# Patient Record
Sex: Female | Born: 1935 | Race: Black or African American | Hispanic: No | Smoking: Never smoker
Health system: Southern US, Community
[De-identification: ages and names within clinical notes are randomized; demographics above are authoritative.]

## PROBLEM LIST (undated history)

## (undated) DIAGNOSIS — M545 Low back pain, unspecified: Secondary | ICD-10-CM

## (undated) DIAGNOSIS — I6789 Other cerebrovascular disease: Secondary | ICD-10-CM

## (undated) DIAGNOSIS — E119 Type 2 diabetes mellitus without complications: Secondary | ICD-10-CM

## (undated) DIAGNOSIS — R269 Unspecified abnormalities of gait and mobility: Secondary | ICD-10-CM

## (undated) DIAGNOSIS — I10 Essential (primary) hypertension: Secondary | ICD-10-CM

## (undated) DIAGNOSIS — I359 Nonrheumatic aortic valve disorder, unspecified: Secondary | ICD-10-CM

## (undated) DIAGNOSIS — L89109 Pressure ulcer of unspecified part of back, unspecified stage: Secondary | ICD-10-CM

## (undated) DIAGNOSIS — G959 Disease of spinal cord, unspecified: Secondary | ICD-10-CM

## (undated) DIAGNOSIS — M1990 Unspecified osteoarthritis, unspecified site: Secondary | ICD-10-CM

## (undated) DIAGNOSIS — E785 Hyperlipidemia, unspecified: Secondary | ICD-10-CM

## (undated) DIAGNOSIS — M6281 Muscle weakness (generalized): Secondary | ICD-10-CM

## (undated) DIAGNOSIS — D649 Anemia, unspecified: Secondary | ICD-10-CM

## (undated) DIAGNOSIS — I6350 Cerebral infarction due to unspecified occlusion or stenosis of unspecified cerebral artery: Secondary | ICD-10-CM

## (undated) DIAGNOSIS — H269 Unspecified cataract: Secondary | ICD-10-CM

## (undated) HISTORY — DX: Unspecified cataract: H26.9

## (undated) HISTORY — DX: Anemia, unspecified: D64.9

## (undated) HISTORY — DX: Unspecified osteoarthritis, unspecified site: M19.90

## (undated) HISTORY — DX: Disease of spinal cord, unspecified: G95.9

## (undated) HISTORY — DX: Unspecified abnormalities of gait and mobility: R26.9

## (undated) HISTORY — DX: Essential (primary) hypertension: I10

## (undated) HISTORY — DX: Muscle weakness (generalized): M62.81

## (undated) HISTORY — DX: Cerebral infarction due to unspecified occlusion or stenosis of unspecified cerebral artery: I63.50

## (undated) HISTORY — PX: ABDOMINAL HYSTERECTOMY: SHX81

## (undated) HISTORY — PX: APPENDECTOMY: SHX54

## (undated) HISTORY — PX: OOPHORECTOMY: SHX86

## (undated) HISTORY — DX: Hyperlipidemia, unspecified: E78.5

## (undated) HISTORY — DX: Nonrheumatic aortic valve disorder, unspecified: I35.9

## (undated) HISTORY — DX: Other cerebrovascular disease: I67.89

## (undated) HISTORY — DX: Low back pain: M54.5

## (undated) HISTORY — DX: Pressure ulcer of unspecified part of back, unspecified stage: L89.109

## (undated) HISTORY — DX: Low back pain, unspecified: M54.50

## (undated) HISTORY — DX: Type 2 diabetes mellitus without complications: E11.9

---

## 1998-04-16 ENCOUNTER — Ambulatory Visit (HOSPITAL_COMMUNITY): Admission: RE | Admit: 1998-04-16 | Discharge: 1998-04-16 | Payer: Self-pay | Admitting: Family Medicine

## 2002-01-29 ENCOUNTER — Ambulatory Visit (HOSPITAL_COMMUNITY): Admission: RE | Admit: 2002-01-29 | Discharge: 2002-01-29 | Payer: Self-pay | Admitting: Internal Medicine

## 2002-01-29 ENCOUNTER — Encounter: Payer: Self-pay | Admitting: Internal Medicine

## 2002-01-31 ENCOUNTER — Encounter: Payer: Self-pay | Admitting: Internal Medicine

## 2002-01-31 ENCOUNTER — Ambulatory Visit (HOSPITAL_COMMUNITY): Admission: RE | Admit: 2002-01-31 | Discharge: 2002-01-31 | Payer: Self-pay | Admitting: Internal Medicine

## 2002-07-27 ENCOUNTER — Encounter: Payer: Self-pay | Admitting: Emergency Medicine

## 2002-07-27 ENCOUNTER — Emergency Department (HOSPITAL_COMMUNITY): Admission: EM | Admit: 2002-07-27 | Discharge: 2002-07-27 | Payer: Self-pay | Admitting: Emergency Medicine

## 2004-03-07 LAB — HM COLONOSCOPY: HM Colonoscopy: NORMAL

## 2004-06-01 ENCOUNTER — Ambulatory Visit: Payer: Self-pay | Admitting: Family Medicine

## 2004-06-14 ENCOUNTER — Ambulatory Visit: Payer: Self-pay | Admitting: Family Medicine

## 2004-06-16 ENCOUNTER — Ambulatory Visit: Payer: Self-pay | Admitting: Family Medicine

## 2004-09-13 ENCOUNTER — Ambulatory Visit: Payer: Self-pay | Admitting: Family Medicine

## 2004-09-18 ENCOUNTER — Emergency Department (HOSPITAL_COMMUNITY): Admission: EM | Admit: 2004-09-18 | Discharge: 2004-09-18 | Payer: Self-pay | Admitting: Emergency Medicine

## 2004-10-17 ENCOUNTER — Ambulatory Visit: Payer: Self-pay | Admitting: Family Medicine

## 2004-11-30 ENCOUNTER — Ambulatory Visit: Payer: Self-pay | Admitting: Family Medicine

## 2004-12-12 ENCOUNTER — Emergency Department (HOSPITAL_COMMUNITY): Admission: EM | Admit: 2004-12-12 | Discharge: 2004-12-12 | Payer: Self-pay | Admitting: Emergency Medicine

## 2005-01-12 ENCOUNTER — Ambulatory Visit: Payer: Self-pay | Admitting: Family Medicine

## 2005-04-24 ENCOUNTER — Ambulatory Visit: Payer: Self-pay | Admitting: Family Medicine

## 2005-06-08 ENCOUNTER — Encounter (INDEPENDENT_AMBULATORY_CARE_PROVIDER_SITE_OTHER): Payer: Self-pay | Admitting: Specialist

## 2005-06-08 ENCOUNTER — Ambulatory Visit (HOSPITAL_COMMUNITY): Admission: RE | Admit: 2005-06-08 | Discharge: 2005-06-08 | Payer: Self-pay | Admitting: Gastroenterology

## 2005-06-14 ENCOUNTER — Ambulatory Visit: Payer: Self-pay | Admitting: Family Medicine

## 2005-08-08 ENCOUNTER — Ambulatory Visit: Payer: Self-pay | Admitting: Family Medicine

## 2005-10-31 ENCOUNTER — Ambulatory Visit: Payer: Self-pay | Admitting: Family Medicine

## 2005-12-12 ENCOUNTER — Ambulatory Visit: Payer: Self-pay | Admitting: Family Medicine

## 2006-03-02 ENCOUNTER — Ambulatory Visit: Payer: Self-pay | Admitting: Family Medicine

## 2006-06-14 ENCOUNTER — Ambulatory Visit: Payer: Self-pay | Admitting: Internal Medicine

## 2006-09-17 ENCOUNTER — Ambulatory Visit: Payer: Self-pay | Admitting: Family Medicine

## 2006-11-29 ENCOUNTER — Ambulatory Visit: Payer: Self-pay | Admitting: Family Medicine

## 2006-12-26 ENCOUNTER — Ambulatory Visit: Payer: Self-pay | Admitting: Family Medicine

## 2007-02-06 DIAGNOSIS — M1990 Unspecified osteoarthritis, unspecified site: Secondary | ICD-10-CM | POA: Insufficient documentation

## 2007-02-06 DIAGNOSIS — E669 Obesity, unspecified: Secondary | ICD-10-CM | POA: Insufficient documentation

## 2007-02-06 DIAGNOSIS — E1169 Type 2 diabetes mellitus with other specified complication: Secondary | ICD-10-CM

## 2007-02-06 DIAGNOSIS — E785 Hyperlipidemia, unspecified: Secondary | ICD-10-CM

## 2007-02-06 DIAGNOSIS — I10 Essential (primary) hypertension: Secondary | ICD-10-CM | POA: Insufficient documentation

## 2007-02-07 ENCOUNTER — Ambulatory Visit: Payer: Self-pay | Admitting: Internal Medicine

## 2007-07-03 ENCOUNTER — Ambulatory Visit: Payer: Self-pay | Admitting: Internal Medicine

## 2007-12-07 ENCOUNTER — Ambulatory Visit: Payer: Self-pay | Admitting: Cardiovascular Disease

## 2007-12-07 ENCOUNTER — Inpatient Hospital Stay (HOSPITAL_COMMUNITY): Admission: EM | Admit: 2007-12-07 | Discharge: 2007-12-12 | Payer: Self-pay | Admitting: Emergency Medicine

## 2007-12-10 ENCOUNTER — Encounter (INDEPENDENT_AMBULATORY_CARE_PROVIDER_SITE_OTHER): Payer: Self-pay | Admitting: Internal Medicine

## 2007-12-11 ENCOUNTER — Encounter (INDEPENDENT_AMBULATORY_CARE_PROVIDER_SITE_OTHER): Payer: Self-pay | Admitting: Internal Medicine

## 2007-12-11 ENCOUNTER — Ambulatory Visit: Payer: Self-pay | Admitting: Vascular Surgery

## 2008-01-03 ENCOUNTER — Ambulatory Visit: Payer: Self-pay | Admitting: Internal Medicine

## 2008-01-15 ENCOUNTER — Ambulatory Visit: Payer: Self-pay | Admitting: Internal Medicine

## 2008-01-15 ENCOUNTER — Encounter (INDEPENDENT_AMBULATORY_CARE_PROVIDER_SITE_OTHER): Payer: Self-pay | Admitting: Family Medicine

## 2008-01-15 LAB — CONVERTED CEMR LAB
CO2: 27 meq/L (ref 19–32)
Creatinine, Ser: 0.72 mg/dL (ref 0.40–1.20)
Potassium: 4 meq/L (ref 3.5–5.3)
Sodium: 141 meq/L (ref 135–145)

## 2008-06-22 ENCOUNTER — Ambulatory Visit: Payer: Self-pay | Admitting: Internal Medicine

## 2008-06-26 ENCOUNTER — Ambulatory Visit: Payer: Self-pay | Admitting: Cardiovascular Disease

## 2008-07-07 DIAGNOSIS — I6350 Cerebral infarction due to unspecified occlusion or stenosis of unspecified cerebral artery: Secondary | ICD-10-CM

## 2008-07-07 HISTORY — DX: Cerebral infarction due to unspecified occlusion or stenosis of unspecified cerebral artery: I63.50

## 2008-07-14 ENCOUNTER — Ambulatory Visit: Payer: Self-pay | Admitting: Internal Medicine

## 2008-07-14 ENCOUNTER — Encounter (INDEPENDENT_AMBULATORY_CARE_PROVIDER_SITE_OTHER): Payer: Self-pay | Admitting: Family Medicine

## 2008-07-14 LAB — CONVERTED CEMR LAB
AST: 18 units/L (ref 0–37)
Alkaline Phosphatase: 117 units/L (ref 39–117)
BUN: 17 mg/dL (ref 6–23)
Chloride: 104 meq/L (ref 96–112)
Creatinine, Ser: 0.79 mg/dL (ref 0.40–1.20)
HDL: 56 mg/dL (ref >39)
LDL Cholesterol: 85 mg/dL (ref 0–99)
Potassium: 4.4 meq/L (ref 3.5–5.3)
Sodium: 143 meq/L (ref 135–145)
Total Bilirubin: 0.5 mg/dL (ref 0.3–1.2)
Total Protein: 6.8 g/dL (ref 6.0–8.3)
Triglycerides: 56 mg/dL (ref <150)
VLDL: 11 mg/dL (ref 0–40)

## 2008-07-19 ENCOUNTER — Ambulatory Visit: Payer: Self-pay | Admitting: Cardiovascular Disease

## 2008-07-20 ENCOUNTER — Inpatient Hospital Stay (HOSPITAL_COMMUNITY): Admission: EM | Admit: 2008-07-20 | Discharge: 2008-07-24 | Payer: Self-pay | Admitting: Emergency Medicine

## 2008-07-21 ENCOUNTER — Ambulatory Visit: Payer: Self-pay | Admitting: Surgery

## 2008-07-21 ENCOUNTER — Encounter (INDEPENDENT_AMBULATORY_CARE_PROVIDER_SITE_OTHER): Payer: Self-pay | Admitting: Internal Medicine

## 2008-07-22 ENCOUNTER — Encounter (INDEPENDENT_AMBULATORY_CARE_PROVIDER_SITE_OTHER): Payer: Self-pay | Admitting: *Deleted

## 2008-08-05 ENCOUNTER — Ambulatory Visit: Payer: Self-pay | Admitting: Internal Medicine

## 2008-10-14 ENCOUNTER — Ambulatory Visit: Payer: Self-pay | Admitting: Internal Medicine

## 2008-10-14 ENCOUNTER — Encounter (INDEPENDENT_AMBULATORY_CARE_PROVIDER_SITE_OTHER): Payer: Self-pay | Admitting: Adult Health

## 2008-10-14 LAB — CONVERTED CEMR LAB: Microalb, Ur: 0.65 mg/dL (ref 0.00–1.89)

## 2008-10-22 ENCOUNTER — Ambulatory Visit: Payer: Self-pay | Admitting: Internal Medicine

## 2009-01-07 ENCOUNTER — Ambulatory Visit: Payer: Self-pay | Admitting: Internal Medicine

## 2009-01-07 LAB — CONVERTED CEMR LAB
AST: 20 units/L (ref 0–37)
Albumin: 4.2 g/dL (ref 3.5–5.2)
Alkaline Phosphatase: 127 units/L — ABNORMAL HIGH (ref 39–117)
CO2: 28 meq/L (ref 19–32)
Sodium: 143 meq/L (ref 135–145)
Total Protein: 6.9 g/dL (ref 6.0–8.3)

## 2009-01-15 ENCOUNTER — Ambulatory Visit: Payer: Self-pay | Admitting: Internal Medicine

## 2009-03-19 ENCOUNTER — Ambulatory Visit: Payer: Self-pay | Admitting: Internal Medicine

## 2009-04-21 ENCOUNTER — Encounter (INDEPENDENT_AMBULATORY_CARE_PROVIDER_SITE_OTHER): Payer: Self-pay | Admitting: Adult Health

## 2009-04-21 ENCOUNTER — Ambulatory Visit: Payer: Self-pay | Admitting: Internal Medicine

## 2009-04-21 LAB — CONVERTED CEMR LAB
ALT: 17 units/L (ref 0–35)
Alkaline Phosphatase: 117 units/L (ref 39–117)
BUN: 19 mg/dL (ref 6–23)
CO2: 29 meq/L (ref 19–32)
Chloride: 105 meq/L (ref 96–112)
Creatinine, Ser: 0.75 mg/dL (ref 0.40–1.20)
HDL: 63 mg/dL (ref >39)
LDL Cholesterol: 85 mg/dL (ref 0–99)
Potassium: 4 meq/L (ref 3.5–5.3)
Total CHOL/HDL Ratio: 2.5
VLDL: 9 mg/dL (ref 0–40)

## 2009-05-25 ENCOUNTER — Ambulatory Visit: Payer: Self-pay | Admitting: Internal Medicine

## 2009-06-14 ENCOUNTER — Ambulatory Visit: Payer: Self-pay | Admitting: Family Medicine

## 2009-06-30 ENCOUNTER — Ambulatory Visit: Payer: Self-pay | Admitting: Cardiovascular Disease

## 2009-06-30 ENCOUNTER — Ambulatory Visit (HOSPITAL_COMMUNITY): Admission: RE | Admit: 2009-06-30 | Discharge: 2009-06-30 | Payer: Self-pay | Admitting: Cardiovascular Disease

## 2009-06-30 ENCOUNTER — Encounter: Payer: Self-pay | Admitting: Cardiovascular Disease

## 2009-06-30 ENCOUNTER — Ambulatory Visit: Payer: Self-pay | Admitting: Cardiology

## 2009-06-30 DIAGNOSIS — I359 Nonrheumatic aortic valve disorder, unspecified: Secondary | ICD-10-CM | POA: Insufficient documentation

## 2009-08-24 ENCOUNTER — Encounter (INDEPENDENT_AMBULATORY_CARE_PROVIDER_SITE_OTHER): Payer: Self-pay | Admitting: Adult Health

## 2009-08-24 ENCOUNTER — Ambulatory Visit: Payer: Self-pay | Admitting: Internal Medicine

## 2009-08-24 LAB — CONVERTED CEMR LAB
ALT: 19 units/L (ref 0–35)
AST: 17 units/L (ref 0–37)
Albumin: 4.3 g/dL (ref 3.5–5.2)
CO2: 30 meq/L (ref 19–32)
Calcium: 9.4 mg/dL (ref 8.4–10.5)
Chloride: 102 meq/L (ref 96–112)
Sodium: 142 meq/L (ref 135–145)
Total CHOL/HDL Ratio: 2.4

## 2009-08-26 ENCOUNTER — Encounter (INDEPENDENT_AMBULATORY_CARE_PROVIDER_SITE_OTHER): Payer: Self-pay | Admitting: *Deleted

## 2009-11-29 ENCOUNTER — Inpatient Hospital Stay (HOSPITAL_COMMUNITY): Admission: EM | Admit: 2009-11-29 | Discharge: 2009-12-03 | Payer: Self-pay | Admitting: Emergency Medicine

## 2009-11-30 ENCOUNTER — Encounter: Payer: Self-pay | Admitting: Internal Medicine

## 2009-11-30 LAB — CONVERTED CEMR LAB: Hgb A1c MFr Bld: 7.3 %

## 2009-12-01 ENCOUNTER — Encounter: Payer: Self-pay | Admitting: Internal Medicine

## 2009-12-01 LAB — CONVERTED CEMR LAB
ALT: 20 units/L
AST: 20 units/L
Alkaline Phosphatase: 93 units/L
CO2: 32 meq/L
Calcium: 8.8 mg/dL
Creatinine, Ser: 0.74 mg/dL
Glucose, Bld: 109 mg/dL
Hemoglobin: 9.3 g/dL
Potassium: 4.1 meq/L
RBC: 3.19 M/uL
RDW: 15.2 %

## 2009-12-02 ENCOUNTER — Encounter: Payer: Self-pay | Admitting: Internal Medicine

## 2009-12-02 LAB — CONVERTED CEMR LAB
Chloride: 105 meq/L
Glucose, Bld: 153 mg/dL
HCT: 29.5 %
Hemoglobin: 9.6 g/dL

## 2010-03-22 ENCOUNTER — Telehealth: Payer: Self-pay | Admitting: Cardiovascular Disease

## 2010-04-07 ENCOUNTER — Encounter
Admission: RE | Admit: 2010-04-07 | Discharge: 2010-06-16 | Payer: Self-pay | Source: Home / Self Care | Admitting: Orthopaedic Surgery

## 2010-04-17 ENCOUNTER — Emergency Department (HOSPITAL_COMMUNITY): Admission: EM | Admit: 2010-04-17 | Discharge: 2010-04-18 | Payer: Self-pay | Admitting: Emergency Medicine

## 2010-04-20 ENCOUNTER — Ambulatory Visit: Payer: Self-pay | Admitting: Internal Medicine

## 2010-04-20 DIAGNOSIS — E8809 Other disorders of plasma-protein metabolism, not elsewhere classified: Secondary | ICD-10-CM

## 2010-04-20 DIAGNOSIS — D649 Anemia, unspecified: Secondary | ICD-10-CM

## 2010-04-20 DIAGNOSIS — M545 Low back pain: Secondary | ICD-10-CM

## 2010-04-20 DIAGNOSIS — R609 Edema, unspecified: Secondary | ICD-10-CM

## 2010-04-21 LAB — CONVERTED CEMR LAB
Albumin: 3.9 g/dL (ref 3.5–5.2)
BUN: 16 mg/dL (ref 6–23)
CO2: 32 meq/L (ref 19–32)
Calcium: 9.8 mg/dL (ref 8.4–10.5)
Chloride: 109 meq/L (ref 96–112)
Cholesterol: 182 mg/dL (ref 0–200)
Creatinine, Ser: 0.5 mg/dL (ref 0.4–1.2)
Glucose, Bld: 163 mg/dL — ABNORMAL HIGH (ref 70–99)
HDL: 71.6 mg/dL (ref >39.00)
Potassium: 5 meq/L (ref 3.5–5.1)
Total CHOL/HDL Ratio: 3
Triglycerides: 50 mg/dL (ref 0.0–149.0)
VLDL: 10 mg/dL (ref 0.0–40.0)

## 2010-05-18 ENCOUNTER — Encounter: Payer: Self-pay | Admitting: Internal Medicine

## 2010-06-10 ENCOUNTER — Ambulatory Visit: Payer: Self-pay | Admitting: Internal Medicine

## 2010-06-10 DIAGNOSIS — R269 Unspecified abnormalities of gait and mobility: Secondary | ICD-10-CM

## 2010-06-10 DIAGNOSIS — I6350 Cerebral infarction due to unspecified occlusion or stenosis of unspecified cerebral artery: Secondary | ICD-10-CM | POA: Insufficient documentation

## 2010-06-16 ENCOUNTER — Telehealth: Payer: Self-pay | Admitting: Internal Medicine

## 2010-06-22 ENCOUNTER — Encounter: Payer: Self-pay | Admitting: Internal Medicine

## 2010-06-24 ENCOUNTER — Telehealth: Payer: Self-pay | Admitting: Internal Medicine

## 2010-07-04 ENCOUNTER — Ambulatory Visit: Payer: Self-pay | Admitting: Internal Medicine

## 2010-07-06 ENCOUNTER — Telehealth: Payer: Self-pay | Admitting: Internal Medicine

## 2010-07-06 ENCOUNTER — Encounter: Payer: Self-pay | Admitting: Internal Medicine

## 2010-07-11 ENCOUNTER — Telehealth: Payer: Self-pay | Admitting: Internal Medicine

## 2010-07-13 ENCOUNTER — Encounter: Payer: Self-pay | Admitting: Internal Medicine

## 2010-07-15 ENCOUNTER — Telehealth: Payer: Self-pay | Admitting: Internal Medicine

## 2010-07-18 ENCOUNTER — Encounter: Payer: Self-pay | Admitting: Internal Medicine

## 2010-07-22 ENCOUNTER — Ambulatory Visit: Payer: Self-pay | Admitting: Internal Medicine

## 2010-07-25 LAB — CONVERTED CEMR LAB: Hgb A1c MFr Bld: 8.9 % — ABNORMAL HIGH (ref 4.6–6.5)

## 2010-07-26 ENCOUNTER — Encounter: Payer: Self-pay | Admitting: Cardiovascular Disease

## 2010-07-26 ENCOUNTER — Ambulatory Visit (HOSPITAL_COMMUNITY)
Admission: RE | Admit: 2010-07-26 | Discharge: 2010-07-26 | Payer: Self-pay | Source: Home / Self Care | Attending: Cardiovascular Disease | Admitting: Cardiovascular Disease

## 2010-07-26 ENCOUNTER — Ambulatory Visit: Payer: Self-pay

## 2010-07-26 ENCOUNTER — Ambulatory Visit: Payer: Self-pay | Admitting: Cardiovascular Disease

## 2010-07-27 ENCOUNTER — Encounter: Payer: Self-pay | Admitting: Internal Medicine

## 2010-07-28 ENCOUNTER — Telehealth: Payer: Self-pay | Admitting: Internal Medicine

## 2010-08-02 ENCOUNTER — Encounter: Payer: Self-pay | Admitting: Internal Medicine

## 2010-08-03 ENCOUNTER — Telehealth: Payer: Self-pay | Admitting: Internal Medicine

## 2010-08-03 ENCOUNTER — Encounter (INDEPENDENT_AMBULATORY_CARE_PROVIDER_SITE_OTHER): Payer: Self-pay | Admitting: *Deleted

## 2010-08-11 ENCOUNTER — Encounter: Payer: Self-pay | Admitting: Internal Medicine

## 2010-08-15 ENCOUNTER — Telehealth: Payer: Self-pay | Admitting: Internal Medicine

## 2010-08-16 ENCOUNTER — Encounter
Admission: RE | Admit: 2010-08-16 | Discharge: 2010-08-16 | Payer: Self-pay | Source: Home / Self Care | Attending: Neurological Surgery | Admitting: Neurological Surgery

## 2010-08-19 ENCOUNTER — Encounter: Payer: Self-pay | Admitting: Internal Medicine

## 2010-08-26 ENCOUNTER — Encounter: Payer: Self-pay | Admitting: Internal Medicine

## 2010-08-26 ENCOUNTER — Ambulatory Visit
Admission: RE | Admit: 2010-08-26 | Discharge: 2010-08-26 | Payer: Self-pay | Source: Home / Self Care | Attending: Internal Medicine | Admitting: Internal Medicine

## 2010-08-26 DIAGNOSIS — L89109 Pressure ulcer of unspecified part of back, unspecified stage: Secondary | ICD-10-CM

## 2010-08-26 DIAGNOSIS — G959 Disease of spinal cord, unspecified: Secondary | ICD-10-CM | POA: Insufficient documentation

## 2010-08-26 DIAGNOSIS — N390 Urinary tract infection, site not specified: Secondary | ICD-10-CM | POA: Insufficient documentation

## 2010-08-26 HISTORY — DX: Pressure ulcer of unspecified part of back, unspecified stage: L89.109

## 2010-08-30 ENCOUNTER — Encounter: Payer: Self-pay | Admitting: Internal Medicine

## 2010-09-06 ENCOUNTER — Encounter: Payer: Self-pay | Admitting: Internal Medicine

## 2010-09-07 HISTORY — PX: ANTERIOR CERVICAL DECOMP/DISCECTOMY FUSION: SHX1161

## 2010-09-08 ENCOUNTER — Encounter: Payer: Self-pay | Admitting: Internal Medicine

## 2010-09-08 ENCOUNTER — Telehealth: Payer: Self-pay | Admitting: Internal Medicine

## 2010-09-08 DIAGNOSIS — L89609 Pressure ulcer of unspecified heel, unspecified stage: Secondary | ICD-10-CM

## 2010-09-08 DIAGNOSIS — L89309 Pressure ulcer of unspecified buttock, unspecified stage: Secondary | ICD-10-CM

## 2010-09-08 DIAGNOSIS — L8995 Pressure ulcer of unspecified site, unstageable: Secondary | ICD-10-CM

## 2010-09-08 NOTE — Progress Notes (Signed)
Summary: med ?  Phone Note From Other Clinic Call back at [phone]   Caller: Cindy-HHRN Summary of Call: Arline Asp from [facility] called wanting clarification on pt's medications. Per their records pt is taking Ferrous Sulfate 325mg  1 by mouth twice daily and they want to know if pt should still be taking medication. Initial call taken by: Brenton Grills CMA Duncan Dull),  July 06, 2010 2:12 PM  Follow-up for Phone Call        yes - i updated our list Follow-up by: Newt Lukes MD,  July 06, 2010 3:13 PM  Additional Follow-up for Phone Call Additional follow up Details #1::        HHRN advised via secure VM, told to call back with any further questions or concerns Additional Follow-up by: Margaret Pyle, CMA,  July 06, 2010 3:32 PM    New/Updated Medications: FERROUS SULFATE 325 (65 FE) MG TABS (FERROUS SULFATE) 1 by mouth two times a day

## 2010-09-08 NOTE — Letter (Signed)
Summary: [facility] Consult Scheduled Letter  [facility]  [address] [place], [place] [address]   Phone: [phone]  Fax: [phone]      08/03/2010 MRN: [id number]  Ashley Fritz [address] [place], [place]  [address]    Dear Ms. Ahlberg,      We have scheduled an appointment for you. At the recommendation of Dr. Felicity Coyer, we have scheduled you a consult with [facility] Dr.Ireton PA on January ,5,2012 Thur  at 10:00/arrive at 9:30 am .  Their phone number is [phone].If this appointment day and time is not convenient for you, please feel free to call the office of the doctor you are being referred to at the number listed above and reschedule the appointment.  [facility] [address] [place], [place] [address]  Thank you,  Patient Care Coordinator [facility]

## 2010-09-08 NOTE — Progress Notes (Signed)
Summary: [facility] request call,didn't leave any information  Phone Note Other Incoming Call back at [phone]   Caller: [facility] Summary of Call: [facility] request call Initial call taken by: Judie Grieve,  March 22, 2010 2:43 PM  Follow-up for Phone Call        Spoke with Home Health RN who reports she saw pt for first time today. RN reports pt needs glucose monitor and social work consult. No chest pain or SOB. Pt has some edema in legs but RN is not sure if this is new as pt is poor historian. RN is concerned pt is not taking meds as prescribed. RN states pt is unable to see primary care until September and she is wondering if pt can see Dr.Jaryn Hocutt to address these issues or if we could assist in getting pt in to see primary care earlier than scheduled appt.  I asked RN who initially prescribed home health orders and if she could follow up with the prescribing provider but she said pt was at [facility] and recently discharged from there and no followup planned. Pt no longer sees [facility]. Will discuss with mangagement as to appropriate plan as pt not having cardiac issues at this time. I told Home Health RN I would  call her back Dossie Arbour, RN, BSN  March 22, 2010 4:01 PM Discussed with Fabio Neighbors, office manager. Need to determine who ordered home health and see if they can provide follow up for pt. If they are unable to provide care pt should go to ED or urgent care to be seen.  We are unable to assist in getting earlier appt with primary care. Dossie Arbour, RN, BSN  March 22, 2010 4:21 PM Spoke with Home Health RN who reports prescriber MD for home health is Dr. Kerry Dory at [facility].  She will check with him and see if he can give order for glucometer and social work consult. I instructed RN pt should be seen in ED or urgent care if needed.  She agrees with this plan. Pt is due for follow up in our office in November so home  health RN will have pt call to schedule. Follow-up by: Dossie Arbour, RN, BSN,  March 22, 2010 4:24 PM

## 2010-09-08 NOTE — Progress Notes (Signed)
Summary: [facility] pt  Phone Note From Other Clinic Call back at [phone]   Caller: [facility] Summary of Call: Cindy-HHRN with [facility] called regarding pt-OT is concerned with pt's loss of upper body strength and hand strength. Pt's hand and arms are numb and are cool to touch. OT states that pt may need MRI of neck and may need re-eval with MD. Also check bs 349   Initial call taken by: Brenton Grills CMA Duncan Dull),  June 24, 2010 3:05 PM  Follow-up for Phone Call        if no fever, or acute bowel or bladder changes and no LE painweakness/numb ok to make appt next avail with Dr Felicity Coyer  If any of these present, or pain increased to the neck, should consider ER evaluation Follow-up by: Corwin Levins MD,  June 24, 2010 4:25 PM  Additional Follow-up for Phone Call Additional follow up Details #1::        left detailed message with MD's advisement on VM, and to callback office with any questions or concerns Additional Follow-up by: Brenton Grills CMA Duncan Dull),  June 24, 2010 4:33 PM

## 2010-09-08 NOTE — Letter (Signed)
Summary: Statement of Medical Necessity / [facility]  Statement of Medical Necessity / [facility]   Imported By: Lennie Odor 06/27/2010 14:43:00  _____________________________________________________________________  External Attachment:    Type:   Image     Comment:   External Document

## 2010-09-08 NOTE — Miscellaneous (Signed)
Summary: Diabetic Referral form / [facility]  Diabetic Referral form / [facility]   Imported By: Lennie Odor 06/27/2010 14:42:10  _____________________________________________________________________  External Attachment:    Type:   Image     Comment:   External Document

## 2010-09-08 NOTE — Letter (Signed)
Summary: CMN for Bed/[facility]  CMN for Bed/[facility]   Imported By: Sherian Rein 09/02/2010 09:37:28  _____________________________________________________________________  External Attachment:    Type:   Image     Comment:   External Document

## 2010-09-08 NOTE — Medication Information (Signed)
Summary: [facility] Profile[facility]  [facility]   Imported By: Sherian Rein 04/25/2010 10:15:28  _____________________________________________________________________  External Attachment:    Type:   Image     Comment:   External Document

## 2010-09-08 NOTE — Miscellaneous (Signed)
Summary: OT Order / [facility]  OT Order / [facility]   Imported By: Lennie Odor 07/15/2010 15:12:23  _____________________________________________________________________  External Attachment:    Type:   Image     Comment:   External Document

## 2010-09-08 NOTE — Miscellaneous (Signed)
Summary: PT orders/[facility]  PT orders/[facility]   Imported By: Sherian Rein 08/03/2010 08:24:22  _____________________________________________________________________  External Attachment:    Type:   Image     Comment:   External Document

## 2010-09-08 NOTE — Assessment & Plan Note (Signed)
Summary: rov   Visit Type:  Follow-up Primary Sherria Riemann:  Newt Lukes MD  CC:  Neuropathy in hands. .  History of Present Illness: Ms. Kops is a pleasant 77 year old African American female with past medical history significant for mild aortic valve stenosis, diabetes mellitus, hyperlipidemia, hypertension, osteoarthritis, and diabetic retinopathy here today for routine follow up. She  was admitted to [facility] in May 2009 with a near-syncopal episode.  Carotid Dopplers were insignificant at the time of that admission.  Echocardiogram showed normal left ventricular function with aortic valve sclerosis and mild-to-moderate left ventricular hypertrophy.  She has had no recurrence of syncope or near syncope. She is now followed here for her aortic valve stenosis.    She did have a a CVA  in December 2009 and was admitted to [facility] at that time. She has no residual deficiits from this.   She is here today for follow up. She has had no exertional chest heaviness, pressure, or pain.  She has no dyspnea with exertion and denies any palpitations, dizziness, orthopnea, PND, or lower extremity edema. She is confined to a wheelchair because of leg weakness. Her echo today shows normal LV function with mild aortic stenosis  and mild MR/AI. Her only complaint is neuropathy in her hands. Dr.Leschber started her on Neurontin for this.   \  Current Medications (verified): 1)  Lipitor 20 Mg  Tabs (Atorvastatin Calcium) .... One Pill At Supper 2)  Adult Aspirin Low Strength 81 Mg  Tbdp (Aspirin) .... One Pill Daily 3)  Losartan Potassium 50 Mg Tabs (Losartan Potassium) .Marland Kitchen.. 1 By Mouth Once Daily 4)  Amlodipine Besylate 5 Mg Tabs (Amlodipine Besylate) .Marland Kitchen.. 1 By Mouth Once Daily 5)  Glimepiride 4 Mg Tabs (Glimepiride) .Marland Kitchen.. 1 By Mouth Two Times A Day 6)  Actos 15 Mg Tabs (Pioglitazone Hcl) .Marland Kitchen.. 1 By Mouth Once Daily 7)  Cvs Stool Softener 100 Mg Caps (Docusate Sodium) .... .take One Capule Two Times  A Day 8)  Furosemide 20 Mg Tabs (Furosemide) .Marland Kitchen.. 1 By Mouth Once Daily As Needed For Swelling 9)  Prodigy Voice Blood Glucose W/device Kit (Blood Glucose Monitoring Suppl) .... Use As Directed 10)  Prodigy No Coding Blood Gluc  Strp (Glucose Blood) .... Use To Check Blood Sugar Two Times A Day Dx: Code 250.00 11)  Prodigy Twist Top Lancets 28g  Misc (Lancets) .... Use Two Times A Day Dx Code; 250.00 12)  Diclofenac Sodium 75 Mg Tbec (Diclofenac Sodium) .... Take 1 Every 8 Hours As Needed For Arthritis Pain 13)  Neurontin 300 Mg Caps (Gabapentin) .Marland Kitchen.. 1 By Mouth At Bedtime  Allergies (verified): No Known Drug Allergies  Past History:  Past Medical History: Reviewed history from 07/22/2010 and no changes required. Diabetes mellitus, type II Hyperlipidemia Hypertension  Osteoarthritis Cataracts CVA 12/09 Syncope Anemia-NOS  low back pain- spinal stenosis?    assoc gait disorder   MD roster: cards: [facility]  Social History: Reviewed history from 07/22/2010 and no changes required. Retired  Used to own Production assistant, radio  No tobacco No drugs No etoh Lives w/ friend (dtr-like [name]) since dc [facility] 03/13/10  Review of Systems  The patient denies fatigue, malaise, fever, weight gain/loss, vision loss, decreased hearing, hoarseness, chest pain, palpitations, shortness of breath, prolonged cough, wheezing, sleep apnea, coughing up blood, abdominal pain, blood in stool, nausea, vomiting, diarrhea, heartburn, incontinence, blood in urine, muscle weakness, joint pain, leg swelling, rash, skin lesions, headache, fainting, dizziness,  depression, anxiety, enlarged lymph nodes, easy bruising or bleeding, and environmental allergies.    Vital Signs:  Patient profile:   77 year old female Height:      62 inches Weight:      164 pounds BMI:     30.10 Pulse rate:   60 / minute Pulse rhythm:   regular Resp:     18 per minute BP sitting:   146 / 68  (left  arm) Cuff size:   large  Vitals Entered By: Marrion Coy, CNA (July 26, 2010 2:04 PM)  Physical Exam  General:  General: Well developed, well nourished, NAD Musculoskeletal: Muscle strength 5/5 all ext Psychiatric: Mood and affect normal Neck: No JVD, no carotid bruits, no thyromegaly, no lymphadenopathy. Lungs:Clear bilaterally, no wheezes, rhonci, crackles CV: RRR no murmurs, gallops rubs Abdomen: soft, NT, ND, BS present Extremities: No edema, pulses 1+.    EKG  Procedure date:  07/26/2010  Findings:      Sinus rhythm, rate 60 bpm. LAD. Poor R wave progression through the precordial leads. unchanged since april 2011.  Impression & Recommendations:  Problem # 1:  AORTIC STENOSIS/ INSUFFICIENCY, NON-RHEUMATIC (ICD-424.1) Mild AS by echo. Repeat echo two years.   Her updated medication list for this problem includes:    Losartan Potassium 50 Mg Tabs (Losartan potassium) .Marland Kitchen... 1 by mouth once daily    Furosemide 20 Mg Tabs (Furosemide) .Marland Kitchen... 1 by mouth once daily as needed for swelling  Orders: EKG w/ Interpretation (93000)  Problem # 2:  HYPERTENSION (ICD-401.9) Her BP is mildly elevated today. I have discussed altering her therapy but she does not wish to change anything today. She ate a salty meal at [facility] last night and blames this on her elevated BP. I will have her check her BP at home and let Dr. Felicity Coyer know if it remains elevated.  She is given parameters.   Her updated medication list for this problem includes:    Adult Aspirin Low Strength 81 Mg Tbdp (Aspirin) ..... One pill daily    Losartan Potassium 50 Mg Tabs (Losartan potassium) .Marland Kitchen... 1 by mouth once daily    Amlodipine Besylate 5 Mg Tabs (Amlodipine besylate) .Marland Kitchen... 1 by mouth once daily    Furosemide 20 Mg Tabs (Furosemide) .Marland Kitchen... 1 by mouth once daily as needed for swelling  Patient Instructions: 1)  Your physician wants you to follow-up in:  1 year.  You will receive a reminder letter in the mail  two months in advance. If you don't receive a letter, please call our office to schedule the follow-up appointment.

## 2010-09-08 NOTE — Consult Note (Signed)
Summary: [facility]  [facility]   Imported By: Lester [place] 08/25/2010 07:21:54  _____________________________________________________________________  External Attachment:    Type:   Image     Comment:   External Document

## 2010-09-08 NOTE — Assessment & Plan Note (Signed)
Summary: NEW/ MEDICARE/MEDICAID/[place]  #   Vital Signs:  Patient profile:   77 year old female Height:      62 inches (157.48 cm) Weight:      166.4 pounds (75.64 kg) BMI:     30.54 O2 Sat:      98 % on Room air Temp:     97.3 degrees F (36.28 degrees C) oral Pulse rate:   71 / minute BP sitting:   152 / 68  (right arm) Cuff size:   large  Vitals Entered By: Ashley Fritz RMA (April 20, 2010 9:59 AM)  O2 Flow:  Room air CC: New patient Is Patient Diabetic? Yes Did you bring your meter with you today? No Comments Not sure of dosages on medications. Daughter states she was just release from [facility] did not give her any medical records.    Primary Care Ashley Fritz:  Ashley Lukes MD  CC:  New patient.  History of Present Illness: new pt to me and our division, here to est care here with her friend who serves as caregiver Ashley Fritz, not blood relative)  1) DM2 - does not regularly check sugars - reports unable to tolerate metformin due to nausea - prev on glimeperide - unsure why changed -  now on actos - notes inc edema since change in med dose - denies signs or symptoms hypoglycemia- no PU/PD  2) edema - onset 1-2 weeks ago - progressive - denies hx same - no change in meds during same time frame (but pt does not have meds/med list or know meds with certainy) denies inc sodium use or diet change - +weight gain but no assoc dyspnea  3) HTN - unsure what meds she takes but reports compliance with ongoing medical treatment and no changes in medication dose or frequency. denies adverse side effects related to current therapy. no CP or HA  4) dyslipidemia- unsure what meds she takes but reports compliance with ongoing medical treatment and no changes in medication dose or frequency. denies adverse side effects related to current therapy. no myalgias  5) arthritis, diffuse - involves knees and low back pain with gait d/o (?spinal stenosis) - precipitated by accidental  fall spring 2011 - follows with [facility] for same - in OPPT for mobility and begun on cymbalta 2 weeks ago  Preventive Screening-Counseling & Management  Caffeine-Diet-Exercise     Does Patient Exercise: no     Exercise Counseling: to improve exercise regimen     Depression Counseling: not indicated; screening negative for depression  Clinical Review Panels:  Prevention   Last Mammogram:  Normal (03/13/2006)   Last Colonoscopy:  Normal (03/07/2004)  Immunizations   Last Tetanus Booster:  Td (07/08/1991)   Last Flu Vaccine:  Fluvax MCR (06/07/2006)   Last Pneumovax:  Pneumovax (06/16/2003)  Lipid Management   Cholesterol:  156 (08/24/2009)   LDL (bad choesterol):  81 (08/24/2009)   HDL (good cholesterol):  65 (08/24/2009)  Diabetes Management   HgBA1C:  7.3 (11/30/2009)   Creatinine:  0.78 (12/02/2009)   Last Flu Vaccine:  Fluvax MCR (06/07/2006)   Last Pneumovax:  Pneumovax (06/16/2003)  CBC   WBC:  5.0 (12/02/2009)   RBC:  3.26 (12/02/2009)   Hgb:  9.6 (12/02/2009)   Hct:  29.5 (12/02/2009)   Platelets:  170 (12/02/2009)   MCV  90.6 (12/02/2009)   RDW  15.2 (12/01/2009)  Complete Metabolic Panel   Glucose:  153 (12/02/2009)   Sodium:  140 (12/02/2009)  Potassium:  4.0 (12/02/2009)   Chloride:  105 (12/02/2009)   CO2:  30 (12/02/2009)   BUN:  16 (12/02/2009)   Creatinine:  0.78 (12/02/2009)   Albumin:  3.1 (12/01/2009)   Total Protein:  5.6 (12/01/2009)   Calcium:  9.1 (12/02/2009)   Total Bili:  0.4 (12/01/2009)   Alk Phos:  93 (12/01/2009)   SGPT (ALT):  20 (12/01/2009)   SGOT (AST):  20 (12/01/2009)   Current Medications (verified): 1)  Lipitor 20 Mg  Tabs (Atorvastatin Calcium) .... One Pill At Supper 2)  Adult Aspirin Low Strength 81 Mg  Tbdp (Aspirin) .... One Pill Daily 3)  Losartan Potassium 50 Mg Tabs (Losartan Potassium) .... Take 1 Tablet By Mouth Once A Day 4)  Cvs Stool Softener 100 Mg Caps (Docusate Sodium) .... .take One Capule Two  Times A Day 5)  Glimepiride 4 Mg Tabs (Glimepiride) .... Take 1 Tablet By Mouth Two Times A Day 6)  Meclizine Hcl 25 Mg Tabs (Meclizine Hcl) .... As Needed 7)  Amlodipine Besylate 10 Mg Tabs (Amlodipine Besylate) .... Take 1 By Mouth Once Daily 8)  Actos 30 Mg Tabs (Pioglitazone Hcl) .... Take 1 By Mouth Once Daily 9)  Hydrocodone-Acetaminophen 5-500 Mg Tabs (Hydrocodone-Acetaminophen) .... Take 2 By Mouth Qid 10)  Metformin Hcl 1000 Mg Tabs (Metformin Hcl) .... Take 1 Two Times A Day 11)  Cymbalta 60 Mg Cpep (Duloxetine Hcl) .... Take 1 By Mouth Once Daily  Allergies (verified): No Known Drug Allergies  Past History:  Past Medical History: Diabetes mellitus, type II Hyperlipidemia Hypertension Osteoarthritis Cataracts CVA 12/09 Syncope Anemia-NOS low back pain- spinal stenosis?  MD roster: cards: Ashley Fritz ortho - Ashley Fritz  Past Surgical History: Appendectomy Hysterectomy  Ovary Removal  Family History: Mother died from complications of DM Father died from complications of etoh abuse  Sister died from non heart related chronic illness  Social History: Retired  Used to own Production assistant, radio No tobacco No drugs No etoh Lives w/ friend (dtr-like Ashley Fritz) since dc Ashley Fritz 8/7/11Does Patient Exercise:  no  Review of Systems       see HPI above. I have reviewed all other systems and they were negative.   Physical Exam  General:  alert, well-developed, well-nourished, and cooperative to examination.    Head:  Normocephalic and atraumatic without obvious abnormalities. No apparent alopecia or balding. Eyes:  vision grossly intact; pupils equal, round and reactive to light.  conjunctiva and lids normal.    Ears:  normal pinnae bilaterally, without erythema, swelling, or tenderness to palpation. TMs clear, without effusion, or cerumen impaction. Hearing grossly normal bilaterally  Mouth:  teeth and gums in good repair; mucous membranes moist, without lesions or  ulcers. oropharynx clear without exudate, no erythema.  Lungs:  normal respiratory effort, no intercostal retractions or use of accessory muscles; normal breath sounds bilaterally - no crackles and no wheezes.    Heart:  normal rate, regular rhythm, no murmur, and no rub. BLE with 1+ edema.  Abdomen:  soft, non-tender, normal bowel sounds, no distention; no masses and no appreciable hepatomegaly or splenomegaly.   Msk:  in WC - no gross deformities - gait not tested Neurologic:  alert & oriented X3 and cranial nerves II-XII symetrically intact.  strength grossly normal in all extremities, sensation intact and gait not tested. speech fluent without dysarthria or aphasia; follows commands with good comprehension.  Skin:  no rashes, vesicles, ulcers, or erythema. No nodules or irregularity to palpation.  Psych:  Oriented X3, memory intact for recent and remote, normally interactive, good eye contact, not anxious appearing, not depressed appearing, and not agitated.      Impression & Recommendations:  Problem # 1:  DIABETES MELLITUS, TYPE II (ICD-250.00)  considerable uncertaintly about meds - pharmacy called and rx'd list reviewed - pt does not have home meds so unable to confirm rx reduce actos from 30mg  to 15 given new edema - stop metformin and change to glimeprid check labs now - ?needs home glucometer - HHRN to help advise reck 6 weeks to cont review The following medications were removed from the medication list:    Actos 15 Mg Tabs (Pioglitazone hcl) .Marland Kitchen... Take 1 tablet by mouth once a day Her updated medication list for this problem includes:    Adult Aspirin Low Strength 81 Mg Tbdp (Aspirin) ..... One pill daily    Losartan Potassium 100 Mg Tabs (Losartan potassium) .Marland Kitchen... 1 by mouth once daily    Glimepiride 2 Mg Tabs (Glimepiride) .Marland Kitchen... 1 by mouth two times a day    Actos 15 Mg Tabs (Pioglitazone hcl) .Marland Kitchen... 1 by mouth once daily  Orders: TLB-A1C / Hgb A1C (Glycohemoglobin)  (83036-A1C)  Labs Reviewed: Creat: 0.78 (12/02/2009)    Reviewed HgBA1c results: 7.3 (11/30/2009)  Problem # 2:  OSTEOARTHRITIS (ICD-715.90) diffuse - knees, back - reports unable to walk since accidental fall 11/2009 -  s/p SNF stay - now OPPT and staying with a friend acting as caregiver - working with ortho on same - on hydrocodne + cymbalta - cont same Her updated medication list for this problem includes:    Adult Aspirin Low Strength 81 Mg Tbdp (Aspirin) ..... One pill daily    Hydrocodone-acetaminophen 5-500 Mg Tabs (Hydrocodone-acetaminophen) .Marland Kitchen... Take 2 by mouth four times a day as needed for pain  Problem # 3:  EDEMA (ICD-782.3)  reduce actos and amlodipine to prior, lower doses - new erx done furosemide to use once daily for edema - check labs Her updated medication list for this problem includes:    Furosemide 20 Mg Tabs (Furosemide) .Marland Kitchen... 1 by mouth once daily as needed for swelling  Orders: TLB-BMP (Basic Metabolic Panel-BMET) (80048-METABOL) TLB-Hepatic/Liver Function Pnl (80076-HEPATIC) TLB-TSH (Thyroid Stimulating Hormone) (84443-TSH)  Discussed elevation of the legs, use of compression stockings, sodium restiction, and medication use.   Problem # 4:  HYPERTENSION (ICD-401.9)  The following medications were removed from the medication list:    Amlodipine Besylate 5 Mg Tabs (Amlodipine besylate) .Marland Kitchen... Take one tablet by mouth daily Her updated medication list for this problem includes:    Losartan Potassium 100 Mg Tabs (Losartan potassium) .Marland Kitchen... 1 by mouth once daily    Amlodipine Besylate 5 Mg Tabs (Amlodipine besylate) .Marland Kitchen... 1 by mouth once daily    Furosemide 20 Mg Tabs (Furosemide) .Marland Kitchen... 1 by mouth once daily as needed for swelling  BP today: 152/68 Prior BP: 162/72 (06/30/2009)  Labs Reviewed: K+: 4.0 (12/02/2009) Creat: : 0.78 (12/02/2009)   Chol: 156 (08/24/2009)   HDL: 65 (08/24/2009)   LDL: 81 (08/24/2009)   TG: 50 (08/24/2009)  Problem # 5:   HYPERLIPIDEMIA (ICD-272.4)  Her updated medication list for this problem includes:    Lipitor 20 Mg Tabs (Atorvastatin calcium) ..... One pill at supper  Orders: TLB-Lipid Panel (80061-LIPID)  Labs Reviewed: SGOT: 20 (12/01/2009)   SGPT: 20 (12/01/2009)   HDL:65 (08/24/2009), 63 (04/21/2009)  LDL:81 (08/24/2009), 85 (04/21/2009)  Chol:156 (08/24/2009), 157 (04/21/2009)  Trig:50 (08/24/2009), 47 (04/21/2009)  Problem #  6:  AORTIC STENOSIS/ INSUFFICIENCY, NON-RHEUMATIC (ICD-424.1)  Her updated medication list for this problem includes:    Adult Aspirin Low Strength 81 Mg Tbdp (Aspirin) ..... One pill daily  06/2009 cards note reviewed: Stable. There is no change since her last echo. Aortic valve sclerosis with no significant stenosis. Repeat echo one year.   Echocardiogram:  SUMMARY   -  Poor images, lateral wall appears hypokinetic The left ventricle         was mildly dilated. Overall left ventricular systolic         function was mildly decreased. Left ventricular ejection         fraction was estimated , range being 45 % to 50 %. Left         ventricular wall thickness was mildly increased.   -  The aortic valve was mildly calcified.   -  The left atrium was mildly dilated.     ---------------------------------------------------------------    Prepared and Electronically Authenticated by    Charlton Haws M.D.   Confirmed 22-Jul-2008 17:30:35  (07/22/2008)  Time spent with patient 45 minutes, more than 50% of this time was spent counseling patient on med review and recent hosp/SNF course since 11/2009 - hosp chart/labs and pharmacy notes reviewed in depth  Complete Medication List: 1)  Lipitor 20 Mg Tabs (Atorvastatin calcium) .... One pill at supper 2)  Adult Aspirin Low Strength 81 Mg Tbdp (Aspirin) .... One pill daily 3)  Losartan Potassium 100 Mg Tabs (Losartan potassium) .Marland Kitchen.. 1 by mouth once daily 4)  Amlodipine Besylate 5 Mg Tabs (Amlodipine besylate) .Marland Kitchen.. 1 by mouth  once daily 5)  Glimepiride 2 Mg Tabs (Glimepiride) .Marland Kitchen.. 1 by mouth two times a day 6)  Actos 15 Mg Tabs (Pioglitazone hcl) .Marland Kitchen.. 1 by mouth once daily 7)  Cymbalta 60 Mg Cpep (Duloxetine hcl) .... Take 1 by mouth once daily 8)  Cvs Stool Softener 100 Mg Caps (Docusate sodium) .... .take one capule two times a day 9)  Hydrocodone-acetaminophen 5-500 Mg Tabs (Hydrocodone-acetaminophen) .... Take 2 by mouth four times a day as needed for pain 10)  Robaxin 500 Mg Tabs (Methocarbamol) .Marland Kitchen.. 1 by mouth three times a day as needed for neck spasm and pain 11)  Furosemide 20 Mg Tabs (Furosemide) .Marland Kitchen.. 1 by mouth once daily as needed for swelling  Patient Instructions: 1)  it was good to see you today. 2)  medication history reviewed - 3)  for diabetes - stop metformin, start glimperide and decrease dose of actos - new prescriptions for these done 4)  for high blood pressure - increase losartan dose, decrease amlodipine dose and add fluid pill for swelling - new prescriptions done 5)  for your pain and spasm - add robaxin for muscle relaxant to your current pain medications- cymbalata and hydrocodone 6)  for cholesterol - continue the lipitor 7)  continue the physical therapy at [facility] 8)  test(s) ordered today - your results will be posted on the phone tree for review in 48-72 hours from the time of test completion; call [phone] and enter your 9 digit MRN (listed above on this page, just below your name); if any changes need to be made or there are abnormal results, you will be contacted directly.  9)  Please schedule a follow-up appointment in 4-6 weeks to continue review ,call sooner if problems.  10)  Ask home health to contact us about your home needs as needed  Prescriptions: FUROSEMIDE 20 MG TABS (  FUROSEMIDE) 1 by mouth once daily as needed for swelling  #30 x 1   Entered and Authorized by:   Ashley Lukes MD   Signed by:   Ashley Lukes MD on 04/20/2010   Method used:    Electronically to        [facility] [address] [phone]* (retail)       [address]       [place], [place]  [address]       Ph: [phone] or [phone]       Fax: [phone]   RxID:   [id number] ACTOS 15 MG TABS (PIOGLITAZONE HCL) 1 by mouth once daily  #30 x 3   Entered and Authorized by:   Ashley Lukes MD   Signed by:   Ashley Lukes MD on 04/20/2010   Method used:   [facility]        [facility] [address] [phone]* (retail)       [address]       [place], [place]  [address]       Ph: [phone] or [phone]       Fax: [phone]   RxID:   [id number] ROBAXIN 500 MG TABS (METHOCARBAMOL) 1 by mouth three times a day as needed for neck spasm and pain  #40 x 1   Entered and Authorized by:   Ashley Lukes MD   Signed by:   Ashley Lukes MD on 04/20/2010   Method used:   [facility]        [facility] [address] [phone]* (retail)       [address]       [place], [place]  [address]       Ph: [phone] or [phone]       Fax: [phone]   RxID:   [id number] LOSARTAN POTASSIUM 100 MG TABS (LOSARTAN POTASSIUM) 1 by mouth once daily  #30 x 3   Entered and Authorized by:   Ashley Lukes MD   Signed by:   Ashley Lukes MD on 04/20/2010   Method used:   [facility]        [facility] [address] [phone]* (retail)       [address]       [place], [place]  [address]       Ph: [phone] or [phone]       Fax: [phone]   RxID:   [id number] GLIMEPIRIDE 2 MG TABS (GLIMEPIRIDE) 1 by mouth two times a day  #60 x 3   Entered and Authorized by:   Ashley Lukes MD   Signed by:   Ashley Lukes MD on 04/20/2010   Method used:   [facility]        [facility] [address] [phone]* (retail)       [address]       [place], [place]  [address]       Ph: [phone] or  [phone]       Fax: [phone]   RxID:   [id number] AMLODIPINE BESYLATE 5 MG TABS (AMLODIPINE BESYLATE) 1 by mouth once daily  #30 x 3   Entered and Authorized by:   Ashley Lukes MD  Signed by:   Ashley Lukes MD on 04/20/2010   Method used:   [facility]ron[facility]        [facility] [address] [phone]* (retail)       [address]       [place], [place]  [address]       Ph: [phone] or [phone]       Fax: [phone]   RxID:   [id number]

## 2010-09-08 NOTE — Miscellaneous (Signed)
Summary: [facility]   Imported By: Lester Forestdale 08/05/2010 10:07:08  _____________________________________________________________________  External Attachment:    Type:   Image     Comment:   External Document

## 2010-09-08 NOTE — Letter (Signed)
Summary: [employer]  [employer]   Imported By: Sherian Rein 06/14/2010 14:39:54  _____________________________________________________________________  External Attachment:    Type:   Image     Comment:   External Document

## 2010-09-08 NOTE — Miscellaneous (Signed)
Summary[place]   Imported By: Sherian Rein 08/10/2010 08:57:58  _____________________________________________________________________  External Attachment:    Type:   Image     Comment:   External Document

## 2010-09-08 NOTE — Miscellaneous (Signed)
Summary: Orders/[facility]  Orders/[facility]   Imported By: Sherian Rein 07/25/2010 09:30:57  _____________________________________________________________________  External Attachment:    Type:   Image     Comment:   External Document

## 2010-09-08 NOTE — Assessment & Plan Note (Signed)
Summary: 6 wk f/u # / cd   Vital Signs:  Patient profile:   76 year old female Height:      62 inches (157.48 cm) O2 Sat:      98 % on Room air Temp:     98.0 degrees F (36.67 degrees C) oral Pulse rate:   50 / minute BP sitting:   118 / 68  (left arm) Cuff size:   large  Vitals Entered By: Orlan Leavens RMA (August 26, 2010 1:42 PM)  O2 Flow:  Room air CC: 6 WEEK FOLLOW-UP Is Patient Diabetic? Yes Did you bring your meter with you today? No Comments States she have some sores on buttock area, and heel & toes   Primary Care Provider:  Newt Lukes MD  CC:  6 WEEK FOLLOW-UP.  History of Present Illness: here for f/u here with neice from [place] today  new dx severe cervical myelopathy - planning for decompression surg related to same with [facility] pt/family with questions and conerns about same -  also c/o skin wounds on sacrum and left great toe  also reviewed chronic issues: 1) DM2 - does not regularly check sugars due to weakness and mech issues using meter - reports unable to tolerate metformin due to nausea -  on glimeperide and actos  - denies signs or symptoms hypoglycemia- no PU/PD  2) HTN - reviewed meds today - reports compliance with ongoing medical treatment and no changes in medication dose or frequency. denies adverse side effects related to current therapy. no CP or HA, no dizziness or syncope  3) dyslipidemia- reviewed meds today - reports compliance with ongoing medical treatment and denies rx'd changes in medication dose or frequency. denies adverse side effects related to current therapy.   4) arthritis, diffuse - involves knees and low back pain with gait d/o (?spinal stenosis) - precipitated by accidental fall spring 2011 - follows with [facility] for same - s/p OPPT for mobility - tried cymbalta, now on diclofenac for pain - still unable to walk and has power w/c  5) progressive weakness - see above re: cervical myelopathy dx - ?related to DDD  and arthirits as above - w/c bound overall - affecting strength ue as well as legs and balance - no neck pain - has fallen x2  Current Medications (verified): 1)  Lipitor 20 Mg  Tabs (Atorvastatin Calcium) .... One Pill At Supper 2)  Adult Aspirin Low Strength 81 Mg  Tbdp (Aspirin) .... One Pill Daily 3)  Losartan Potassium 50 Mg Tabs (Losartan Potassium) .Marland Kitchen.. 1 By Mouth Once Daily 4)  Amlodipine Besylate 5 Mg Tabs (Amlodipine Besylate) .Marland Kitchen.. 1 By Mouth Once Daily 5)  Glimepiride 4 Mg Tabs (Glimepiride) .Marland Kitchen.. 1 By Mouth Two Times A Day 6)  Actos 15 Mg Tabs (Pioglitazone Hcl) .Marland Kitchen.. 1 By Mouth Once Daily 7)  Cvs Stool Softener 100 Mg Caps (Docusate Sodium) .... .take One Capule Two Times A Day 8)  Furosemide 20 Mg Tabs (Furosemide) .Marland Kitchen.. 1 By Mouth Once Daily As Needed For Swelling 9)  Prodigy Voice Blood Glucose W/device Kit (Blood Glucose Monitoring Suppl) .... Use As Directed 10)  Prodigy No Coding Blood Gluc  Strp (Glucose Blood) .... Use To Check Blood Sugar Two Times A Day Dx: Code 250.00 11)  Prodigy Twist Top Lancets 28g  Misc (Lancets) .... Use Two Times A Day Dx Code; 250.00 12)  Diclofenac Sodium 75 Mg Tbec (Diclofenac Sodium) .... Take 1 Every 8 Hours As Needed  For Arthritis Pain 13)  Neurontin 300 Mg Caps (Gabapentin) .Marland Kitchen.. 1 By Mouth At Bedtime  Allergies (verified): No Known Drug Allergies  Past History:  Past Medical History: Diabetes mellitus, type II Hyperlipidemia Hypertension  Osteoarthritis Cataracts CVA 12/09 Syncope Anemia-NOS  low back pain- spinal stenosis?    assoc gait disorder   MD roster: cards: [facility] - [facility] - [facility]  Review of Systems  The patient denies fever, vision loss, chest pain, syncope, and headaches.         also c/o dysuria, ?infx per hhrn  Physical Exam  General:  chronically ill but alert, well-developed, well-nourished, and cooperative to examination.  niece at side and another female neighbor/friend Lungs:  normal  respiratory effort, no intercostal retractions or use of accessory muscles; normal breath sounds bilaterally - no crackles and no wheezes.    Heart:  normal rate, regular rhythm, no murmur, and no rub. BLE with 1+ edema.  Neurologic:  alert & oriented X3 and cranial nerves II-XII symetrically intact.  periph strength grossly diminished but weakly move all extremities; unable to stand or move her own WC; gait not tested. speech fluent without dysarthria or aphasia; follows commands with good comprehension.  Skin:  stage 1 pressure changes over sacrum and left great toe (eval via skin pictures on neice's phone today as unable to postion pt in room today )   Impression & Recommendations:  Problem # 1:  PRESSURE ULCER LOWER BACK (ICD-707.03) complicated by "partial paralysis" due to cervical myelopathy cont hh care and wound mgmt - new order today - equipment such as hoyer lift or sliding board approp -  hope for imprpved mobility and fx after cervical decompression - ses next Orders: Home Health Referral (Home Health)  Problem # 2:  MYELOPATHY, CERVICAL SPINE (ICD-336.9) progressive weakness in BLE and BUE due to same needs urgent cervical decompression to avoid complete paralysis - explained same to pt and family today nsurg notes reviewed - pt med stable to undergo surg without further med eval -  anticipate snf rehab after surg - family to consider options (preop ekg attempted - poor positioning and pt holding breath = poor and ineffective tracings) no cardaic symptoms on hx, normal exam - defer further eval to anesthesia as needed preop  Problem # 3:  UTI (ICD-599.0)  tx emperic as unable to collect urine sample today  Her updated medication list for this problem includes:    Cipro 250 Mg Tabs (Ciprofloxacin hcl) .Marland Kitchen... 1 by mouth two times a day x 7 days for bladder infection  Orders: Prescription Created Electronically [id number])  Complete Medication List: 1)  Lipitor 20 Mg Tabs  (Atorvastatin calcium) .... One pill at supper 2)  Adult Aspirin Low Strength 81 Mg Tbdp (Aspirin) .... One pill daily 3)  Losartan Potassium 50 Mg Tabs (Losartan potassium) .Marland Kitchen.. 1 by mouth once daily 4)  Amlodipine Besylate 5 Mg Tabs (Amlodipine besylate) .Marland Kitchen.. 1 by mouth once daily 5)  Glimepiride 4 Mg Tabs (Glimepiride) .Marland Kitchen.. 1 by mouth two times a day 6)  Actos 15 Mg Tabs (Pioglitazone hcl) .Marland Kitchen.. 1 by mouth once daily 7)  Cvs Stool Softener 100 Mg Caps (Docusate sodium) .... .take one capule two times a day 8)  Furosemide 20 Mg Tabs (Furosemide) .Marland Kitchen.. 1 by mouth once daily as needed for swelling 9)  Prodigy Voice Blood Glucose W/device Kit (Blood glucose monitoring suppl) .... Use as directed 10)  Prodigy No Coding Blood Gluc Strp (Glucose blood) .Marland KitchenMarland KitchenMarland Kitchen  Use to check blood sugar two times a day dx: code 250.00 11)  Prodigy Twist Top Lancets 28g Misc (Lancets) .... Use two times a day dx code; 250.00 12)  Diclofenac Sodium 75 Mg Tbec (Diclofenac sodium) .... Take 1 every 8 hours as needed for arthritis pain 13)  Neurontin 300 Mg Caps (Gabapentin) .Marland Kitchen.. 1 by mouth at bedtime 14)  Cipro 250 Mg Tabs (Ciprofloxacin hcl) .Marland Kitchen.. 1 by mouth two times a day x 7 days for bladder infection  Patient Instructions: 1)  it was good to see you today. 2)  you are clear to proceed with neck surgery as planned and reviewed today 3)  continue neurontin for neuropathy symptoms and all other medications as previously recommended 4)  cipro antibioitc for bladder infection - your prescriptions have been electronically submitted to your pharmacy. Please take as directed. Contact our office if you believe you're having problems with the medication(s).  5)  we'll make referral for home health to treat skin wounds. Our office will contact you regarding this appointment once made.  6)  Please schedule a follow-up appointment after surgery and rehab to review diabetes, blood pressure and medications; call sooner if problems.    Prescriptions: CIPRO 250 MG TABS (CIPROFLOXACIN HCL) 1 by mouth two times a day x 7 days for bladder infection  #14 x 0   Entered and Authorized by:   Newt Lukes MD   Signed by:   Newt Lukes MD on 08/26/2010   Method used:   [facility]        [facility] [address] [phone]* (retail)       [address]       [place], [place]  [address]       Ph: [phone] or [phone]       Fax: [phone]   RxID:   [id number]    Orders Added: 1)  Home Health Referral [facility]] 2)  Est. Patient Level IV [03474] 3)  Prescription Created Electronically [phone]

## 2010-09-08 NOTE — Miscellaneous (Signed)
Summary: Face to face encounter/[facility]  Face to face encounter/[facility]   Imported By: Sherian Rein 07/11/2010 08:14:32  _____________________________________________________________________  External Attachment:    Type:   Image     Comment:   External Document

## 2010-09-08 NOTE — Assessment & Plan Note (Signed)
Summary: 6 WK FU STC   Vital Signs:  Patient profile:   77 year old female Height:      62 inches (157.48 cm) Weight:      166 pounds (75.45 kg) O2 Sat:      99 % on Room air Temp:     98.6 degrees F (37.00 degrees C) oral Pulse rate:   63 / minute BP sitting:   138 / 84  (left arm) Cuff size:   large  Vitals Entered By: Orlan Leavens RMA (July 22, 2010 2:21 PM)  O2 Flow:  Room air CC: 6 WEEK FOLLOW-UP Is Patient Diabetic? Yes Did you bring your meter with you today? No Pain Assessment Patient in pain? yes     Location: legs Type: aching Onset of pain  want to get something for neuropathy Comments Pt want to discuss Glimepiride. Also want to know about multivitamins. Req refill on Lipitor   Primary Care Provider:  Newt Lukes MD  CC:  6 WEEK FOLLOW-UP.  History of Present Illness: here for f/u here with neice from [place] today  1) DM2 - does not regularly check sugars due to weakness and mech issue using meter - reports unable to tolerate metformin due to nausea -  on glimeperide and actos, uncelar dose (has rx for both 2mg  and 4mg  tabs)  - denies signs or symptoms hypoglycemia- no PU/PD  2) HTN - reviewed meds today - reports compliance with ongoing medical treatment and no changes in medication dose or frequency. denies adverse side effects related to current therapy. no CP or HA  3) dyslipidemia- reviewed meds today - reports noncompliance with ongoing medical treatment due to no refills, but no rx'd changes in medication dose or frequency. denies adverse side effects related to current therapy.   4) arthritis, diffuse - involves knees and low back pain with gait d/o (?spinal stenosis) - precipitated by accidental fall spring 2011 - follows with [facility] for same - in OPPT for mobility - tried cymbalta, now on diclofenac for pain - still unable to walk and has initialted power w/c application (here today for same)  5) progressive weakness - ?related to  DDD and arthirits as above - w/c bound overall - affecting strength ue as well as legs and balance - no neck pain - has fallen x2  Clinical Review Panels:  Diabetes Management   HgBA1C:  11.8 (04/20/2010)   Creatinine:  0.5 (04/20/2010)   Last Flu Vaccine:  Fluvax 3+ (06/10/2010)   Last Pneumovax:  Pneumovax (06/16/2003)  CBC   WBC:  5.0 (12/02/2009)   RBC:  3.26 (12/02/2009)   Hgb:  9.6 (12/02/2009)   Hct:  29.5 (12/02/2009)   Platelets:  170 (12/02/2009)   MCV  90.6 (12/02/2009)   RDW  15.2 (12/01/2009)  Complete Metabolic Panel   Glucose:  163 (04/20/2010)   Sodium:  146 (04/20/2010)   Potassium:  5.0 (04/20/2010)   Chloride:  109 (04/20/2010)   CO2:  32 (04/20/2010)   BUN:  16 (04/20/2010)   Creatinine:  0.5 (04/20/2010)   Albumin:  3.9 (04/20/2010)   Total Protein:  6.7 (04/20/2010)   Calcium:  9.8 (04/20/2010)   Total Bili:  0.4 (04/20/2010)   Alk Phos:  80 (04/20/2010)   SGPT (ALT):  19 (04/20/2010)   SGOT (AST):  18 (04/20/2010)   Current Medications (verified): 1)  Lipitor 20 Mg  Tabs (Atorvastatin Calcium) .... One Pill At Supper 2)  Adult Aspirin Low Strength 81  Mg  Tbdp (Aspirin) .... One Pill Daily 3)  Losartan Potassium 50 Mg Tabs (Losartan Potassium) .Marland Kitchen.. 1 By Mouth Once Daily 4)  Amlodipine Besylate 5 Mg Tabs (Amlodipine Besylate) .Marland Kitchen.. 1 By Mouth Once Daily 5)  Glimepiride 2 Mg Tabs (Glimepiride) .Marland Kitchen.. 1 By Mouth Two Times A Day 6)  Actos 15 Mg Tabs (Pioglitazone Hcl) .Marland Kitchen.. 1 By Mouth Once Daily 7)  Cymbalta 60 Mg Cpep (Duloxetine Hcl) .... Take 1 By Mouth Once Daily 8)  Cvs Stool Softener 100 Mg Caps (Docusate Sodium) .... .take One Capule Two Times A Day 9)  Robaxin 500 Mg Tabs (Methocarbamol) .Marland Kitchen.. 1 By Mouth Three Times A Day As Needed For Neck Spasm and Pain 10)  Furosemide 20 Mg Tabs (Furosemide) .Marland Kitchen.. 1 By Mouth Once Daily As Needed For Swelling 11)  Prodigy Voice Blood Glucose W/device Kit (Blood Glucose Monitoring Suppl) .... Use As Directed 12)   Prodigy No Coding Blood Gluc  Strp (Glucose Blood) .... Use To Check Blood Sugar Two Times A Day Dx: Code 250.00 13)  Prodigy Twist Top Lancets 28g  Misc (Lancets) .... Use Two Times A Day Dx Code; 250.00 14)  Ferrous Sulfate 325 (65 Fe) Mg Tabs (Ferrous Sulfate) .Marland Kitchen.. 1 By Mouth Two Times A Day 15)  Diclofenac Sodium 75 Mg Tbec (Diclofenac Sodium) .... Take 1 Every 8 Hours As Needed For Arthritis Pain  Allergies (verified): No Known Drug Allergies  Past History:  Past Medical History: Diabetes mellitus, type II Hyperlipidemia Hypertension  Osteoarthritis Cataracts CVA 12/09 Syncope Anemia-NOS  low back pain- spinal stenosis?    assoc gait disorder   MD roster: cards: [facility] - [facility]  Family History: Mother died from complications of DM Father died from complications of etoh abuse  Sister died from non heart related chronic illness   Social History: Retired  Used to own Production assistant, radio  No tobacco No drugs No etoh Lives w/ friend (dtr-like [name]) since dc [facility] 03/13/10  Review of Systems       The patient complains of muscle weakness and difficulty walking.  The patient denies fever, weight loss, syncope, headaches, and suspicious skin lesions.    Physical Exam  General:  chronically ill but alert, well-developed, well-nourished, and cooperative to examination.  niece at side Eyes:  vision grossly intact; pupils equal, round and reactive to light.  conjunctiva and lids normal.    Lungs:  normal respiratory effort, no intercostal retractions or use of accessory muscles; normal breath sounds bilaterally - no crackles and no wheezes.    Heart:  normal rate, regular rhythm, no murmur, and no rub. BLE with 1+ edema.  Msk:  in WC - no gross deformities - gait not tested due to instability and weakness in hip flex/ext and poor balance Neurologic:  alert & oriented X3 and cranial nerves II-XII symetrically intact.  periph strength grossly diminished but  moves indep in all extremities and unable to stand or move her own WC, sensation intact; gait not tested. speech fluent without dysarthria or aphasia; follows commands with good comprehension.    Impression & Recommendations:  Problem # 1:  GAIT DISTURBANCE (ICD-781.2)  progressive - ?DMneuropathy, ?cervical myelopathy or severe disabling OA/DDD - has been working with PT/OT as ordered by ortho ([facility]) not improved with steroid shots (unclear if hip or back injections per pt hx) progressive since fall 11/2009 - [facility] records reviewed - refer to nsurg for review of 04/2010 CT neck - ?MRI or ncs/emg -  defer to nsurg opinion cont pt and pt efforts and start gabapentin for discomfort symptoms (avoid narcotics) - erx done  Orders: Neurosurgeon Referral Psychologist, educational) Prescription Created Electronically [phone])  Problem # 2:  DIABETES MELLITUS, TYPE II (ICD-250.00)  unclear med hx and dosing -check a1c now - inc OHA if uncontrolled Her updated medication list for this problem includes:    Adult Aspirin Low Strength 81 Mg Tbdp (Aspirin) ..... One pill daily    Losartan Potassium 50 Mg Tabs (Losartan potassium) .Marland Kitchen... 1 by mouth once daily    Glimepiride 4 Mg Tabs (Glimepiride) .Marland Kitchen... 1 by mouth two times a day    Actos 15 Mg Tabs (Pioglitazone hcl) .Marland Kitchen... 1 by mouth once daily  Orders: TLB-A1C / Hgb A1C (Glycohemoglobin) (83036-A1C)  Labs Reviewed: Creat: 0.5 (04/20/2010)    Reviewed HgBA1c results: 11.8 (04/20/2010)  7.3 (11/30/2009)  Problem # 3:  LOW BACK PAIN (ICD-724.2) has been seen at [facility] by dr. Magnus Ivan for same The following medications were removed from the medication list:    Hydrocodone-acetaminophen 5-500 Mg Tabs (Hydrocodone-acetaminophen) .Marland Kitchen... Take 2 by mouth four times a day as needed for pain Her updated medication list for this problem includes:    Adult Aspirin Low Strength 81 Mg Tbdp (Aspirin) ..... One pill daily    Robaxin 500 Mg Tabs (Methocarbamol)  .Marland Kitchen... 1 by mouth three times a day as needed for neck spasm and pain    Diclofenac Sodium 75 Mg Tbec (Diclofenac sodium) .Marland Kitchen... Take 1 every 8 hours as needed for arthritis pain  Orders: Neurosurgeon Referral (Neurosurgeon)  Complete Medication List: 1)  Lipitor 20 Mg Tabs (Atorvastatin calcium) .... One pill at supper 2)  Adult Aspirin Low Strength 81 Mg Tbdp (Aspirin) .... One pill daily 3)  Losartan Potassium 50 Mg Tabs (Losartan potassium) .Marland Kitchen.. 1 by mouth once daily 4)  Amlodipine Besylate 5 Mg Tabs (Amlodipine besylate) .Marland Kitchen.. 1 by mouth once daily 5)  Glimepiride 4 Mg Tabs (Glimepiride) .Marland Kitchen.. 1 by mouth two times a day 6)  Actos 15 Mg Tabs (Pioglitazone hcl) .Marland Kitchen.. 1 by mouth once daily 7)  Cvs Stool Softener 100 Mg Caps (Docusate sodium) .... .take one capule two times a day 8)  Robaxin 500 Mg Tabs (Methocarbamol) .Marland Kitchen.. 1 by mouth three times a day as needed for neck spasm and pain 9)  Furosemide 20 Mg Tabs (Furosemide) .Marland Kitchen.. 1 by mouth once daily as needed for swelling 10)  Prodigy Voice Blood Glucose W/device Kit (Blood glucose monitoring suppl) .... Use as directed 11)  Prodigy No Coding Blood Gluc Strp (Glucose blood) .... Use to check blood sugar two times a day dx: code 250.00 12)  Prodigy Twist Top Lancets 28g Misc (Lancets) .... Use two times a day dx code; 250.00 13)  Ferrous Sulfate 325 (65 Fe) Mg Tabs (Ferrous sulfate) .Marland Kitchen.. 1 by mouth two times a day 14)  Diclofenac Sodium 75 Mg Tbec (Diclofenac sodium) .... Take 1 every 8 hours as needed for arthritis pain 15)  Neurontin 300 Mg Caps (Gabapentin) .Marland Kitchen.. 1 by mouth at bedtime  Patient Instructions: 1)  it was good to see you today. 2)  test(s) ordered today - your results will becalled to you after review in 48-72 hours from the time of test completion; if any changes need to be made or there are abnormal results, you will be notified at this time 3)  start neurontin for neuropathy symptoms - also resume lipitor - your prescriptions  have been electronically submitted  to your pharmacy. Please take as directed. Contact our office if you believe you're having problems with the medication(s).   4)  Please schedule a follow-up appointment in 6 weeks to review diabetes, blood pressure and medications, call sooner if problems.  Prescriptions: LIPITOR 20 MG  TABS (ATORVASTATIN CALCIUM) one pill at supper  #30 Tablet x 5   Entered and Authorized by:   Newt Lukes MD   Signed by:   Newt Lukes MD on 07/22/2010   Method used:   [facility]        [facility] [address] [phone]* (retail)       [address]       [place], [place]  [address]       Ph: [phone] or [phone]       Fax: [phone]   RxID:   [id number] NEURONTIN 300 MG CAPS (GABAPENTIN) 1 by mouth at bedtime  #30 x 3   Entered and Authorized by:   Newt Lukes MD   Signed by:   Newt Lukes MD on 07/22/2010   Method used:   [facility] to        [facility] [address] [phone]* (retail)       [address]       [place], [place]  [address]       Ph: [phone] or [phone]       Fax: [phone]   RxID:   [id number]    Orders Added: 1)  TLB-A1C / Hgb A1C (Glycohemoglobin) [83036-A1C] 2)  Neurosurgeon Referral [Neurosurgeon] 3)  Est. Patient Level IV [60737] 4)  Prescription Created Electronically [phone]

## 2010-09-08 NOTE — Consult Note (Signed)
Summary: Stefani Dama MD/[facility]  Stefani Dama MD/[facility]   Imported By: Lester [place] 08/30/2010 11:32:31  _____________________________________________________________________  External Attachment:    Type:   Image     Comment:   External Document

## 2010-09-08 NOTE — Letter (Signed)
Summary: [employer]  [employer]   Imported By: Sherian Rein 05/20/2010 08:30:30  _____________________________________________________________________  External Attachment:    Type:   Image     Comment:   External Document

## 2010-09-08 NOTE — Progress Notes (Signed)
Summary: DME req  Phone Note Call from Patient Call back at Home Phone [phone]   Caller: Daughter Ashley Fritz Summary of Call: Pt's daughter called requesting Hospital bed for pt to [facility]. Initial call taken by: Margaret Pyle, CMA,  August 15, 2010 2:18 PM  Follow-up for Phone Call        ok - order done as requested Follow-up by: Newt Lukes MD,  August 15, 2010 4:08 PM  Additional Follow-up for Phone Call Additional follow up Details #1::        no answer, no VM. Margaret Pyle, CMA  August 15, 2010 4:54 PM    Pt's daughter advised of referral.  Additional Follow-up by: Margaret Pyle, CMA,  August 16, 2010 8:04 AM

## 2010-09-08 NOTE — Progress Notes (Signed)
Summary: [facility] - HOME HEALTH  Phone Note From Other Clinic   Caller: LISA - [facility] [phone] Summary of Call: FYI -Pt was referred for PT & OT. [facility] will be a 3 to 5 day delay in services due to staffing. If not ok, please call [facility].  Initial call taken by: Lamar Sprinkles, CMA,  June 16, 2010 12:15 PM  Follow-up for Phone Call        ok Follow-up by: Newt Lukes MD,  June 16, 2010 12:24 PM

## 2010-09-08 NOTE — Assessment & Plan Note (Signed)
Summary: FU-PER GRANDDAU/JUANITA-STC   Vital Signs:  Patient profile:   77 year old female Height:      62 inches (157.48 cm) Weight:      166 pounds (75.45 kg) O2 Sat:      99 % on Room air Temp:     97.6 degrees F (36.44 degrees C) oral Pulse rate:   67 / minute BP sitting:   124 / 72  (left arm) Cuff size:   large  Vitals Entered By: Orlan Leavens RMA (June 10, 2010 9:03 AM)  O2 Flow:  Room air CC: 2 month follow-up Is Patient Diabetic? Yes Did you bring your meter with you today? No Pain Assessment Patient in pain? no        Primary Care Provider:  Newt Lukes MD  CC:  2 month follow-up.  History of Present Illness: here for f/u here with her friend & driver Jonny Ruiz - prev w/ friend & caregiver juanita griffin (not blood relative)  here for face to face for power wheelchair  also reviewed chronic med issues 1) DM2 - does not regularly check sugars due to no meter - reports unable to tolerate metformin due to nausea - prev on glimeperide -  now on actos - notes inc edema since change in med dose - denies signs or symptoms hypoglycemia- no PU/PD  2) HTN - reviewed meds today - reports compliance with ongoing medical treatment and no changes in medication dose or frequency. denies adverse side effects related to current therapy. no CP or HA  3) dyslipidemia- reviewed meds today - reports noncompliance with ongoing medical treatment due to no refills, but no rx'd changes in medication dose or frequency. denies adverse side effects related to current therapy.   4) arthritis, diffuse - involves knees and low back pain with gait d/o (?spinal stenosis) - precipitated by accidental fall spring 2011 - follows with [facility] for same - in OPPT for mobility and begun on cymbalta 2 weeks ago to help with pain - still unable to walk and has initialted power w/c application (here today for same)  Clinical Review Panels:  Immunizations   Last Tetanus Booster:  Td  (07/08/1991)   Last Flu Vaccine:  Fluvax 3+ (06/10/2010)   Last Pneumovax:  Pneumovax (06/16/2003)  Lipid Management   Cholesterol:  182 (04/20/2010)   LDL (bad choesterol):  100 (04/20/2010)   HDL (good cholesterol):  71.60 (04/20/2010)  Diabetes Management   HgBA1C:  11.8 (04/20/2010)   Creatinine:  0.5 (04/20/2010)   Last Flu Vaccine:  Fluvax 3+ (06/10/2010)   Last Pneumovax:  Pneumovax (06/16/2003)  CBC   WBC:  5.0 (12/02/2009)   RBC:  3.26 (12/02/2009)   Hgb:  9.6 (12/02/2009)   Hct:  29.5 (12/02/2009)   Platelets:  170 (12/02/2009)   MCV  90.6 (12/02/2009)   RDW  15.2 (12/01/2009)  Complete Metabolic Panel   Glucose:  163 (04/20/2010)   Sodium:  146 (04/20/2010)   Potassium:  5.0 (04/20/2010)   Chloride:  109 (04/20/2010)   CO2:  32 (04/20/2010)   BUN:  16 (04/20/2010)   Creatinine:  0.5 (04/20/2010)   Albumin:  3.9 (04/20/2010)   Total Protein:  6.7 (04/20/2010)   Calcium:  9.8 (04/20/2010)   Total Bili:  0.4 (04/20/2010)   Alk Phos:  80 (04/20/2010)   SGPT (ALT):  19 (04/20/2010)   SGOT (AST):  18 (04/20/2010)   Current Medications (verified): 1)  Lipitor 20 Mg  Tabs (Atorvastatin  Calcium) .... One Pill At Supper 2)  Adult Aspirin Low Strength 81 Mg  Tbdp (Aspirin) .... One Pill Daily 3)  Losartan Potassium 100 Mg Tabs (Losartan Potassium) .Marland Kitchen.. 1 By Mouth Once Daily 4)  Amlodipine Besylate 5 Mg Tabs (Amlodipine Besylate) .Marland Kitchen.. 1 By Mouth Once Daily 5)  Glimepiride 2 Mg Tabs (Glimepiride) .Marland Kitchen.. 1 By Mouth Two Times A Day 6)  Actos 15 Mg Tabs (Pioglitazone Hcl) .Marland Kitchen.. 1 By Mouth Once Daily 7)  Cymbalta 60 Mg Cpep (Duloxetine Hcl) .... Take 1 By Mouth Once Daily 8)  Cvs Stool Softener 100 Mg Caps (Docusate Sodium) .... .take One Capule Two Times A Day 9)  Hydrocodone-Acetaminophen 5-500 Mg Tabs (Hydrocodone-Acetaminophen) .... Take 2 By Mouth Four Times A Day As Needed For Pain 10)  Robaxin 500 Mg Tabs (Methocarbamol) .Marland Kitchen.. 1 By Mouth Three Times A Day As Needed For  Neck Spasm and Pain 11)  Furosemide 20 Mg Tabs (Furosemide) .Marland Kitchen.. 1 By Mouth Once Daily As Needed For Swelling  Allergies (verified): No Known Drug Allergies  Past History:  Past medical, surgical, family and social histories (including risk factors) reviewed, and no changes noted (except as noted below).  Past Medical History: Diabetes mellitus, type II Hyperlipidemia Hypertension  Osteoarthritis Cataracts CVA 12/09 Syncope Anemia-NOS  low back pain- spinal stenosis?    assoc gait disorder  MD roster: cards: [facility] [place]  Past Surgical History: Reviewed history from 04/20/2010 and no changes required. Appendectomy Hysterectomy  Ovary Removal  Family History: Reviewed history from 04/20/2010 and no changes required. Mother died from complications of DM Father died from complications of etoh abuse  Sister died from non heart related chronic illness  Social History: Reviewed history from 04/20/2010 and no changes required. Retired  Used to own Production assistant, radio No tobacco No drugs No etoh Lives w/ friend (dtr-like [name]) since dc [facility] 03/13/10  Review of Systems       The patient complains of difficulty walking.  The patient denies fever, chest pain, and dyspnea on exertion.         otherwise, see HPI above. I have reviewed all other systems and they were negative.   Physical Exam  General:  alert, well-developed, well-nourished, and cooperative to examination.   friend at side Lungs:  normal respiratory effort, no intercostal retractions or use of accessory muscles; normal breath sounds bilaterally - no crackles and no wheezes.    Heart:  normal rate, regular rhythm, no murmur, and no rub. BLE with 1+ edema.  Msk:  in WC - no gross deformities - gait not tested due to instability and weakness in hip flex/ext and poor balance Neurologic:  alert & oriented X3 and cranial nerves II-XII symetrically intact.  periph strength grossly diminished  as moves indep in all extremities but unable to stand or move her own WC, sensation intact; gait not tested. speech fluent without dysarthria or aphasia; follows commands with good comprehension.    Impression & Recommendations:  Problem # 1:  DIABETES MELLITUS, TYPE II (ICD-250.00) Assessment Deteriorated  Her updated medication list for this problem includes:    Adult Aspirin Low Strength 81 Mg Tbdp (Aspirin) ..... One pill daily    Losartan Potassium 50 Mg Tabs (Losartan potassium) .Marland Kitchen... 1 by mouth once daily    Glimepiride 2 Mg Tabs (Glimepiride) .Marland Kitchen... 1 by mouth two times a day    Actos 15 Mg Tabs (Pioglitazone hcl) .Marland Kitchen... 1 by mouth once daily  Labs Reviewed: Creat:  0.5 (04/20/2010)    Reviewed HgBA1c results: 11.8 (04/20/2010)  7.3 (11/30/2009)  prolonged review of medications and scheduled time with pt/friend today - has not been on OHA - resume now  Time spent with patient 45 minutes, more than 50% of this time was spent counseling patient on medications and problems related to uncontrolled DM - also on completion of forms for power wc  Problem # 2:  OSTEOARTHRITIS (ICD-715.90)  Her updated medication list for this problem includes:    Adult Aspirin Low Strength 81 Mg Tbdp (Aspirin) ..... One pill daily    Hydrocodone-acetaminophen 5-500 Mg Tabs (Hydrocodone-acetaminophen) .Marland Kitchen... Take 2 by mouth four times a day as needed for pain  diffuse - knees, back - reports unable to walk since accidental fall 11/2009 -  s/p SNF stay - now OPPT 2x/wk and staying with a friend acting as caregiver - working with ortho on same - on hydrocodone + cymbalta - cont same - also order HH as able  Orders: Home Health Referral (Home Health)  Problem # 3:  HYPERLIPIDEMIA (ICD-272.4)  Her updated medication list for this problem includes:    Lipitor 20 Mg Tabs (Atorvastatin calcium) ..... One pill at supper  Labs Reviewed: SGOT: 18 (04/20/2010)   SGPT: 19 (04/20/2010)   HDL:71.60  (04/20/2010), 65 (08/24/2009)  LDL:100 (04/20/2010), 81 (21/30/8657)  Chol:182 (04/20/2010), 156 (08/24/2009)  Trig:50.0 (04/20/2010), 50 (08/24/2009)  Problem # 4:  HYPERTENSION (ICD-401.9)  meds reviewed and updated today - new rx done  Her updated medication list for this problem includes:    Losartan Potassium 50 Mg Tabs (Losartan potassium) .Marland Kitchen... 1 by mouth once daily    Amlodipine Besylate 5 Mg Tabs (Amlodipine besylate) .Marland Kitchen... 1 by mouth once daily    Furosemide 20 Mg Tabs (Furosemide) .Marland Kitchen... 1 by mouth once daily as needed for swelling  BP today: 124/72 Prior BP: 152/68 (04/20/2010)  Labs Reviewed: K+: 5.0 (04/20/2010) Creat: : 0.5 (04/20/2010)   Chol: 182 (04/20/2010)   HDL: 71.60 (04/20/2010)   LDL: 100 (04/20/2010)   TG: 50.0 (04/20/2010)  Orders: Prescription Created Electronically [phone]) Examination has taken place today for a power wheelchair. Because of medical issues such as arthritis and gait disorder, pt is unable to participate in independant tolieting, bathing and dressing. Mobility limitation can not be overcome with cane/walker due to leg weakness and pain and she lacks upper body strength for manual WC. Scooter is not an option due to large turning radius. Power WC will allow pt to complete her ADLs. Pt is cognitively/physically capable of operating power WC and willing to do so. I recommend use of power WC.   Complete Medication List: 1)  Lipitor 20 Mg Tabs (Atorvastatin calcium) .... One pill at supper 2)  Adult Aspirin Low Strength 81 Mg Tbdp (Aspirin) .... One pill daily 3)  Losartan Potassium 50 Mg Tabs (Losartan potassium) .Marland Kitchen.. 1 by mouth once daily 4)  Amlodipine Besylate 5 Mg Tabs (Amlodipine besylate) .Marland Kitchen.. 1 by mouth once daily 5)  Glimepiride 2 Mg Tabs (Glimepiride) .Marland Kitchen.. 1 by mouth two times a day 6)  Actos 15 Mg Tabs (Pioglitazone hcl) .Marland Kitchen.. 1 by mouth once daily 7)  Cymbalta 60 Mg Cpep (Duloxetine hcl) .... Take 1 by mouth once daily 8)  Cvs Stool  Softener 100 Mg Caps (Docusate sodium) .... .take one capule two times a day 9)  Hydrocodone-acetaminophen 5-500 Mg Tabs (Hydrocodone-acetaminophen) .... Take 2 by mouth four times a day as needed for pain 10)  Robaxin 500  Mg Tabs (Methocarbamol) .Marland Kitchen.. 1 by mouth three times a day as needed for neck spasm and pain 11)  Furosemide 20 Mg Tabs (Furosemide) .Marland Kitchen.. 1 by mouth once daily as needed for swelling 12)  Prodigy Voice Blood Glucose W/device Kit (Blood glucose monitoring suppl) .... Use as directed 13)  Prodigy No Coding Blood Gluc Strp (Glucose blood) .... Use to check blood sugar two times a day dx: code 250.00 14)  Prodigy Twist Top Lancets 28g Misc (Lancets) .... Use two times a day dx code; 250.00  Other Orders: Flu Vaccine 88yrs + MEDICARE PATIENTS (X3244) Administration Flu vaccine - MCR (W1027)  Patient Instructions: 1)  it was good to see you today. 2)  medications reviewed today - see below [facility] 3)  we'll make referral for home health therapy. Our office will contact you regarding this appointment once made.  4)  also completed forms for power wheelchair 5)  Please schedule a follow-up appointment in 6 weeks to review diabetes, blood pressure and medications, call sooner if problems. will recheck diabetes labs at next visit Prescriptions: PRODIGY TWIST TOP LANCETS 28G  MISC (LANCETS) use two times a day dx code; 250.00  #60 x 5   Entered by:   Orlan Leavens RMA   Authorized by:   Newt Lukes MD   Signed by:   Orlan Leavens RMA on 06/10/2010   Method used:   [facility]        [facility] [address] [phone]* (retail)       [address]       [place], [place]  [address]       Ph: [phone] or [phone]       Fax: [phone]   RxID:   [id number] PRODIGY NO CODING BLOOD GLUC  STRP (GLUCOSE BLOOD) use to check blood sugar two times a day dx: code 250.00  #60 x 5   Entered by:   Orlan Leavens RMA   Authorized by:   Newt Lukes MD   Signed by:   Orlan Leavens RMA on 06/10/2010   Method used:   [facility]        [facility] [address] [phone]* (retail)       [address]       [place], [place]  [address]       Ph: [phone] or [phone]       Fax: [phone]   RxID:   [id number] PRODIGY VOICE BLOOD GLUCOSE W/DEVICE KIT (BLOOD GLUCOSE MONITORING SUPPL) use as directed  #1 x 0   Entered by:   Orlan Leavens RMA   Authorized by:   Newt Lukes MD   Signed by:   Orlan Leavens RMA on 06/10/2010   Method used:   [facility]        [facility] [address] [phone]* (retail)       [address]       [place], [place]  [address]       Ph: [phone] or [phone]       Fax: [phone]   RxID:   [id number] YKDXIPJA POTASSIUM 50 MG TABS (LOSARTAN POTASSIUM) 1 by mouth once daily  #30 x 6   Entered and Authorized by:   Newt Lukes MD   Signed by:   Newt Lukes MD on  06/10/2010   Method used:   Elect[facility]ically to        [facility] [address] [phone]* (retail)       [address]       [place], [place]  [address]       Ph: [phone] or [phone]       Fax: [phone]   RxID:   [id number]    Orders Added: 1)  Flu Vaccine 56yrs + MEDICARE PATIENTS [Q2039] 2)  Administration Flu vaccine - MCR [G0008] 3)  Est. Patient Level V [[address]644] 4)  Prescription Created Electronically [G8553] 5)  Home Health Referral [facility]] Flu Vaccine Consent Questions     Do you have a history of severe allergic reactions to this vaccine? no    Any prior history of allergic reactions to egg and/or gelatin? no    Do you have a sensitivity to the preservative Thimersol? no    Do you have a past history of Guillan-Barre Syndrome? no    Do you currently have an acute febrile illness? no    Have you ever had a severe reaction to latex? no    Vaccine information given and explained to patient? yes    Are you  currently pregnant? no    Lot Number:[id number]   Exp Date:02/04/2011   Site Given  Left Deltoid IMd: 1)  Flu Vaccine 63yrs + MEDICARE PATIENTS [Q2039] 2)  Administration Flu vaccine - MCR [G0008]   .lbmedflu

## 2010-09-08 NOTE — Progress Notes (Signed)
Summary: Verbal orders  Phone Note Other Incoming   Caller: [facility] with [facility] Arline Asp [phone] Summary of Call: HHRN called requesting verbal order for [facility] Aide and continuation of Skilled RN for 2 additional weeks. Pt also in need of refills. Initial call taken by: Margaret Pyle, CMA,  July 15, 2010 1:46 PM  Follow-up for Phone Call        ok for cont [facility] aide - refills ok as done - thanks Follow-up by: Newt Lukes MD,  July 18, 2010 8:17 AM  Additional Follow-up for Phone Call Additional follow up Details #1::        Cindy advised via secure VM Additional Follow-up by: Margaret Pyle, CMA,  July 18, 2010 8:50 AM    Prescriptions: FUROSEMIDE 20 MG TABS (FUROSEMIDE) 1 by mouth once daily as needed for swelling  #30 x 1   Entered by:   Margaret Pyle, CMA   Authorized by:   Newt Lukes MD   Signed by:   Margaret Pyle, CMA on 07/15/2010   Method used:   [facility] to        [facility] [address] [phone]* (retail)       [address]       [place], [place]  [address]       Ph: [phone] or [phone]       Fax: [phone]   RxID:   [id number] ACTOS 15 MG TABS (PIOGLITAZONE HCL) 1 by mouth once daily  #30 x 3   Entered by:   Margaret Pyle, CMA   Authorized by:   Newt Lukes MD   Signed by:   Margaret Pyle, CMA on 07/15/2010   Method used:   Electronically to        [facility] [address] [phone]* (retail)       [address]       [place], [place]  [address]       Ph: [phone] or [phone]       Fax: [phone]   RxID:   [id number] AMLODIPINE BESYLATE 5 MG TABS (AMLODIPINE BESYLATE) 1 by mouth once daily  #30 x 3   Entered by:   Margaret Pyle, CMA   Authorized by:   Newt Lukes MD   Signed by:   Margaret Pyle, CMA on 07/15/2010   Method used:   Electronically to        [facility] [address] [phone]*  (retail)       [address]       [place], [place]  [address]       Ph: [phone] or [phone]       Fax: [phone]   RxID:   [id number] LOSARTAN POTASSIUM 50 MG TABS (LOSARTAN POTASSIUM) 1 by mouth once daily  #30 x 3   Entered by:   Margaret Pyle, CMA   Authorized by:   Newt Lukes MD   Signed by:   Margaret Pyle, CMA on 07/15/2010   Method used:   Electronically to        [facility] [address] [phone]* (retail)       [address]       [place], [place]  [address]       Ph: [phone] or [phone]  Fax: [phone]   RxID:   [id number]

## 2010-09-08 NOTE — Progress Notes (Signed)
Summary: completed hh nursing  Phone Note From Other Clinic   Caller: Cindy/ HHRN Request: Talk with Nurse Details for Reason: FYI Summary of Call: Want to let md know skill nursing is d/c patient. Pt want to go to a skill nursing facility whenever she makes her mind up. Initial call taken by: Orlan Leavens RMA,  August 03, 2010 2:30 PM  Follow-up for Phone Call        ok - noted - thanks Follow-up by: Newt Lukes MD,  August 03, 2010 3:04 PM

## 2010-09-08 NOTE — Progress Notes (Signed)
Summary: Verbal orders  Phone Note Other Incoming   Caller: OT with [facility] [phone] Summary of Call: OT called requesting verbal for pt to continue OT starting next week 2 times/ week for 4 additional weeks. Initial call taken by: Margaret Pyle, CMA,  July 11, 2010 1:50 PM  Follow-up for Phone Call        ok Follow-up by: Newt Lukes MD,  July 11, 2010 4:13 PM  Additional Follow-up for Phone Call Additional follow up Details #1::        HHOT informed via VM Additional Follow-up by: Margaret Pyle, CMA,  July 11, 2010 4:24 PM

## 2010-09-08 NOTE — Miscellaneous (Signed)
Summary: Care Plans/[facility]  [facility]   Imported By: Sherian Rein 07/07/2010 10:50:50  _____________________________________________________________________  External Attachment:    Type:   Image     Comment:   External Document

## 2010-09-08 NOTE — Progress Notes (Signed)
Summary: [facility] verbal  Phone Note Other Incoming   Caller: [place] Lint [facility] [phone] Summary of Call: HHRN called requesting orders for Social Worker for possible placement for pt, verbal okay. Initial call taken by: Margaret Pyle, CMA,  July 28, 2010 1:55 PM  Follow-up for Phone Call        yes - thanks Follow-up by: Newt Lukes MD,  July 29, 2010 8:19 AM  Additional Follow-up for Phone Call Additional follow up Details #1::        [facility] advised of verbal per MD Additional Follow-up by: Margaret Pyle, CMA,  July 29, 2010 8:34 AM

## 2010-09-08 NOTE — Letter (Signed)
Summary: *HSN Results Follow up  [facility]  [address] [place], [place] [address]   Phone: [phone]  Fax: [phone]      08/26/2009   ISABELA NARDELLI [address] [place], [place]  [address]   Dear  Ms. Lucilia Corallo,                            ____S.Drinkard,FNP   __X__A. Chamkasem,FNP       ____B. McPherson,MD   ____V. Rankins,MD    ____E. Mulberry,MD    ____N. Daphine Deutscher, FNP  ____D. Reche Dixon, MD    ____K. Philipp Deputy, MD    ____Other     This letter is to inform you that your recent test(s):  _______Pap Smear    ___X____Lab Test     _______X-ray    ____X___ is within acceptable limits  _______ requires a medication change  _______ requires a follow-up lab visit  _______ requires a follow-up visit with your provider   Comments: Your CMET and Cholesterol are within normal limits. Continue taking all medications including Lipitor 20 mg every night.       _________________________________________________________ If you have any questions, please contact our office                     Sincerely,  Gaylyn Cheers RN [facility]

## 2010-09-14 NOTE — Progress Notes (Signed)
Summary: HHRN?  Phone Note Other Incoming   Caller: [place] Lint [facility] [phone] Summary of Call: Arline Asp called to inform MD that pt home situation is very unsafe for pt. HH comes in for 1.5hr in the am and 1.5hr in the pm to transfer pt form bed to motorized wheelchair. Per Arline Asp pt has been trying to get up on her own and has been losing control of her bowels and bladder in the process and has not bee found for many hours after. Arline Asp thinks pt needs to be placed in a nursing facility but pt does not think anything is wrong. Pt does not have any family but needs around the clock care. Arline Asp is requesting MD advise and/or call pt and advise her to let RN try to have her placed, please advise. Initial call taken by: Margaret Pyle, CMA,  September 08, 2010 11:10 AM  Follow-up for Phone Call        i agree with cindy's concerns - i have discussed same with pt and pt's niece and friends - but pt and "family" have refused placement - ok to have RN try to work on placement arrangments as i believe 24h care would be safer for pt -- but pt/family may cont to disagree and block the process - thanks for the update Follow-up by: Newt Lukes MD,  September 08, 2010 11:15 AM  Additional Follow-up for Phone Call Additional follow up Details #1::        Arline Asp advised of above and will continue to update VAL Additional Follow-up by: Margaret Pyle, CMA,  September 08, 2010 1:43 PM

## 2010-09-15 ENCOUNTER — Other Ambulatory Visit (HOSPITAL_COMMUNITY): Payer: Self-pay | Admitting: Neurological Surgery

## 2010-09-15 ENCOUNTER — Encounter (HOSPITAL_COMMUNITY)
Admission: RE | Admit: 2010-09-15 | Discharge: 2010-09-15 | Disposition: A | Discharge status: Home / Self Care | Payer: Medicare Other | Source: Ambulatory Visit | Attending: Neurological Surgery | Admitting: Neurological Surgery

## 2010-09-15 DIAGNOSIS — M4302 Spondylolysis, cervical region: Secondary | ICD-10-CM

## 2010-09-15 DIAGNOSIS — Z01812 Encounter for preprocedural laboratory examination: Secondary | ICD-10-CM | POA: Insufficient documentation

## 2010-09-15 DIAGNOSIS — Z01818 Encounter for other preprocedural examination: Secondary | ICD-10-CM | POA: Insufficient documentation

## 2010-09-15 LAB — BASIC METABOLIC PANEL
BUN: 14 mg/dL (ref 6–23)
CO2: 31 mEq/L (ref 19–32)
Calcium: 9.6 mg/dL (ref 8.4–10.5)
Creatinine, Ser: 0.66 mg/dL (ref 0.4–1.2)
Glucose, Bld: 129 mg/dL — ABNORMAL HIGH (ref 70–99)

## 2010-09-15 LAB — CBC
HCT: 35.4 % — ABNORMAL LOW (ref 36.0–46.0)
Hemoglobin: 11.8 g/dL — ABNORMAL LOW (ref 12.0–15.0)
MCH: 29.5 pg (ref 26.0–34.0)
MCHC: 33.3 g/dL (ref 30.0–36.0)

## 2010-09-19 ENCOUNTER — Inpatient Hospital Stay (HOSPITAL_COMMUNITY): Payer: Medicare Other

## 2010-09-19 ENCOUNTER — Inpatient Hospital Stay (HOSPITAL_COMMUNITY)
Admission: RE | Admit: 2010-09-19 | Discharge: 2010-09-22 | DRG: 472 | Disposition: A | Discharge status: Rehabilitation Facility | Payer: Medicare Other | Source: Ambulatory Visit | Attending: Neurological Surgery | Admitting: Neurological Surgery

## 2010-09-19 DIAGNOSIS — Z01818 Encounter for other preprocedural examination: Secondary | ICD-10-CM

## 2010-09-19 DIAGNOSIS — E785 Hyperlipidemia, unspecified: Secondary | ICD-10-CM | POA: Diagnosis present

## 2010-09-19 DIAGNOSIS — E119 Type 2 diabetes mellitus without complications: Secondary | ICD-10-CM | POA: Diagnosis present

## 2010-09-19 DIAGNOSIS — Z01812 Encounter for preprocedural laboratory examination: Secondary | ICD-10-CM

## 2010-09-19 DIAGNOSIS — M5000 Cervical disc disorder with myelopathy, unspecified cervical region: Secondary | ICD-10-CM | POA: Diagnosis present

## 2010-09-19 DIAGNOSIS — I10 Essential (primary) hypertension: Secondary | ICD-10-CM | POA: Diagnosis present

## 2010-09-19 DIAGNOSIS — M4712 Other spondylosis with myelopathy, cervical region: Principal | ICD-10-CM | POA: Diagnosis present

## 2010-09-19 DIAGNOSIS — I739 Peripheral vascular disease, unspecified: Secondary | ICD-10-CM | POA: Diagnosis present

## 2010-09-19 LAB — GLUCOSE, CAPILLARY
Glucose-Capillary: 105 mg/dL — ABNORMAL HIGH (ref 70–99)
Glucose-Capillary: 123 mg/dL — ABNORMAL HIGH (ref 70–99)
Glucose-Capillary: 255 mg/dL — ABNORMAL HIGH (ref 70–99)
Glucose-Capillary: 213 mg/dL — ABNORMAL HIGH (ref 70–99)

## 2010-09-20 DIAGNOSIS — M4712 Other spondylosis with myelopathy, cervical region: Secondary | ICD-10-CM

## 2010-09-20 LAB — GLUCOSE, CAPILLARY: Glucose-Capillary: 167 mg/dL — ABNORMAL HIGH (ref 70–99)

## 2010-09-21 ENCOUNTER — Inpatient Hospital Stay (HOSPITAL_COMMUNITY): Payer: Medicare Other

## 2010-09-21 LAB — GLUCOSE, CAPILLARY
Glucose-Capillary: 99 mg/dL (ref 70–99)
Glucose-Capillary: 204 mg/dL — ABNORMAL HIGH (ref 70–99)

## 2010-09-22 ENCOUNTER — Inpatient Hospital Stay (HOSPITAL_COMMUNITY)
Admission: RE | Admit: 2010-09-22 | Discharge: 2010-10-12 | DRG: 945 | Disposition: A | Discharge status: Skilled Nursing Facility | Payer: Medicare Other | Source: Other Acute Inpatient Hospital | Attending: Physical Medicine & Rehabilitation | Admitting: Physical Medicine & Rehabilitation

## 2010-09-22 ENCOUNTER — Inpatient Hospital Stay (HOSPITAL_COMMUNITY): Payer: Medicare Other

## 2010-09-22 DIAGNOSIS — G8250 Quadriplegia, unspecified: Secondary | ICD-10-CM

## 2010-09-22 DIAGNOSIS — Z5189 Encounter for other specified aftercare: Secondary | ICD-10-CM

## 2010-09-22 DIAGNOSIS — Z7982 Long term (current) use of aspirin: Secondary | ICD-10-CM

## 2010-09-22 DIAGNOSIS — Z79899 Other long term (current) drug therapy: Secondary | ICD-10-CM

## 2010-09-22 DIAGNOSIS — E785 Hyperlipidemia, unspecified: Secondary | ICD-10-CM

## 2010-09-22 DIAGNOSIS — L97509 Non-pressure chronic ulcer of other part of unspecified foot with unspecified severity: Secondary | ICD-10-CM

## 2010-09-22 DIAGNOSIS — I10 Essential (primary) hypertension: Secondary | ICD-10-CM

## 2010-09-22 DIAGNOSIS — M4712 Other spondylosis with myelopathy, cervical region: Secondary | ICD-10-CM

## 2010-09-22 DIAGNOSIS — E119 Type 2 diabetes mellitus without complications: Secondary | ICD-10-CM

## 2010-09-22 DIAGNOSIS — L8990 Pressure ulcer of unspecified site, unspecified stage: Secondary | ICD-10-CM

## 2010-09-22 DIAGNOSIS — N319 Neuromuscular dysfunction of bladder, unspecified: Secondary | ICD-10-CM

## 2010-09-22 DIAGNOSIS — Z8673 Personal history of transient ischemic attack (TIA), and cerebral infarction without residual deficits: Secondary | ICD-10-CM

## 2010-09-22 DIAGNOSIS — Z8249 Family history of ischemic heart disease and other diseases of the circulatory system: Secondary | ICD-10-CM

## 2010-09-22 DIAGNOSIS — Z981 Arthrodesis status: Secondary | ICD-10-CM

## 2010-09-22 DIAGNOSIS — H5440 Blindness, one eye, unspecified eye: Secondary | ICD-10-CM

## 2010-09-22 DIAGNOSIS — R339 Retention of urine, unspecified: Secondary | ICD-10-CM

## 2010-09-22 DIAGNOSIS — L89609 Pressure ulcer of unspecified heel, unspecified stage: Secondary | ICD-10-CM

## 2010-09-22 DIAGNOSIS — M5000 Cervical disc disorder with myelopathy, unspecified cervical region: Secondary | ICD-10-CM

## 2010-09-22 LAB — GLUCOSE, CAPILLARY
Glucose-Capillary: 151 mg/dL — ABNORMAL HIGH (ref 70–99)
Glucose-Capillary: 209 mg/dL — ABNORMAL HIGH (ref 70–99)

## 2010-09-22 NOTE — Miscellaneous (Signed)
Summary: Order/[facility]  Order/[facility]   Imported By: Lester  09/12/2010 10:42:32  _____________________________________________________________________  External Attachment:    Type:   Image     Comment:   External Document

## 2010-09-22 NOTE — Miscellaneous (Signed)
Summary: Plan/[facility]  Plan/[facility]   Imported By: Lester [place] 09/13/2010 09:22:35  _____________________________________________________________________  External Attachment:    Type:   Image     Comment:   External Document

## 2010-09-23 ENCOUNTER — Inpatient Hospital Stay (HOSPITAL_COMMUNITY): Payer: Medicare Other

## 2010-09-23 LAB — DIFFERENTIAL
Eosinophils Relative: 1 % (ref 0–5)
Lymphocytes Relative: 48 % — ABNORMAL HIGH (ref 12–46)
Lymphs Abs: 2 10*3/uL (ref 0.7–4.0)

## 2010-09-23 LAB — CBC
HCT: 29 % — ABNORMAL LOW (ref 36.0–46.0)
MCV: 88.7 fL (ref 78.0–100.0)
RBC: 3.27 MIL/uL — ABNORMAL LOW (ref 3.87–5.11)
RDW: 15.1 % (ref 11.5–15.5)
WBC: 4.3 10*3/uL (ref 4.0–10.5)

## 2010-09-23 LAB — GLUCOSE, CAPILLARY
Glucose-Capillary: 132 mg/dL — ABNORMAL HIGH (ref 70–99)
Glucose-Capillary: 164 mg/dL — ABNORMAL HIGH (ref 70–99)

## 2010-09-23 LAB — URINALYSIS, MICROSCOPIC ONLY
Hgb urine dipstick: NEGATIVE
Nitrite: NEGATIVE
Protein, ur: NEGATIVE mg/dL
Specific Gravity, Urine: 1.012 (ref 1.005–1.030)
Urobilinogen, UA: 1 mg/dL (ref 0.0–1.0)

## 2010-09-23 LAB — COMPREHENSIVE METABOLIC PANEL
Alkaline Phosphatase: 75 U/L (ref 39–117)
BUN: 12 mg/dL (ref 6–23)
CO2: 35 mEq/L — ABNORMAL HIGH (ref 19–32)
Chloride: 101 mEq/L (ref 96–112)
GFR calc non Af Amer: >60 mL/min (ref >60)
Glucose, Bld: 120 mg/dL — ABNORMAL HIGH (ref 70–99)
Potassium: 3.8 mEq/L (ref 3.5–5.1)
Total Bilirubin: 0.5 mg/dL (ref 0.3–1.2)

## 2010-09-24 ENCOUNTER — Inpatient Hospital Stay (HOSPITAL_COMMUNITY): Payer: Medicare Other

## 2010-09-24 DIAGNOSIS — M4712 Other spondylosis with myelopathy, cervical region: Secondary | ICD-10-CM

## 2010-09-24 DIAGNOSIS — G8250 Quadriplegia, unspecified: Secondary | ICD-10-CM

## 2010-09-24 LAB — URINE CULTURE
Culture  Setup Time: 201202172207
Culture: NO GROWTH
Special Requests: NORMAL

## 2010-09-24 LAB — GLUCOSE, CAPILLARY
Glucose-Capillary: 130 mg/dL — ABNORMAL HIGH (ref 70–99)
Glucose-Capillary: 151 mg/dL — ABNORMAL HIGH (ref 70–99)
Glucose-Capillary: 126 mg/dL — ABNORMAL HIGH (ref 70–99)

## 2010-09-25 LAB — GLUCOSE, CAPILLARY: Glucose-Capillary: 219 mg/dL — ABNORMAL HIGH (ref 70–99)

## 2010-09-26 LAB — GLUCOSE, CAPILLARY
Glucose-Capillary: 128 mg/dL — ABNORMAL HIGH (ref 70–99)
Glucose-Capillary: 128 mg/dL — ABNORMAL HIGH (ref 70–99)

## 2010-09-27 LAB — GLUCOSE, CAPILLARY
Glucose-Capillary: 117 mg/dL — ABNORMAL HIGH (ref 70–99)
Glucose-Capillary: 174 mg/dL — ABNORMAL HIGH (ref 70–99)
Glucose-Capillary: 116 mg/dL — ABNORMAL HIGH (ref 70–99)

## 2010-09-28 LAB — GLUCOSE, CAPILLARY
Glucose-Capillary: 156 mg/dL — ABNORMAL HIGH (ref 70–99)
Glucose-Capillary: 198 mg/dL — ABNORMAL HIGH (ref 70–99)

## 2010-09-28 NOTE — H&P (Signed)
Ashley Fritz, Ashley Fritz              ACCOUNT NO.:  [id number]  MEDICAL RECORD NO.:  [id number]           PATIENT TYPE:  I  LOCATION:  4037                         FACILITY:  [facility]  PHYSICIAN:  Erick Colace, M.D.DATE OF BIRTH:  1926/09/14  DATE OF ADMISSION:  09/22/2010 DATE OF DISCHARGE:                             HISTORY & PHYSICAL   CHIEF COMPLAINT:  Weakness in the arms and legs.  An 77 year old female with prior history of CVA and degenerative disk disease who was admitted on September 19, 2010, with progressive weakness in the hands and feet as well as decreased ambulation.  MRI showed cervical spondylosis, herniated nucleus pulposus, cord signal changes at C3-4, underwent ACDF C3-4 on September 19, 2010, per Dr. Barnett Abu. She was fitted with a soft cervical collar postoperatively.  The patient had coughing with meals.  X-rays on September 21, 2010, showed some postop soft tissue swelling, retropharyngeal.  No Decadron advised secondary to diabetes.  REVIEW OF SYSTEMS:  Positive for weakness, numbness in the hands and feet.  She has some pain in the hands and feet.  He is blind in the right eye.  GI AND RESPIRATORY:  Negative.  Review of systems otherwise negative.  PAST HISTORY: 1. Hypertension. 2. Left caudate nucleus CVA in 2009. 3. NIDDM. 4. Appendectomy. 5. Oophorectomy.  HABITS:  Negative EtOH, negative tobacco.  FAMILY HISTORY:  Positive CAD.  SOCIAL HISTORY:  Lives alone.  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  Family in and out check on as needed, needs from [place] to assist.  The patient had been in the SNF in August 2011 for weakness and falls, returned to home after that.  Used power scooter and a walker at home.  Meds at home include Losartan, Neurontin, Norvasc, Lipitor, Lasix, aspirin, Amaryl, Actos.  Hemoglobin A1c 8.9.  Last BUN 14, creatinine 0.6.  Last hemoglobin 11.8, platelets 224,000, and white count 4.7.  PHYSICAL EXAMINATION:   VITAL SIGNS:  Blood pressure 120/72, pulse 70, respirations 20, temp 99. GENERAL:  Elderly female, in no acute distress. HEENT:  Eyes, anicteric, noninjected.  External ENT normal. NECK:  Supple without adenopathy. RESPIRATORY:  Effort is good.  Lungs are clear. HEART:  Regular rate and rhythm.  No rubs, murmurs, or extra sounds. ABDOMEN: Positive bowel sounds, soft, nontender to palpation. EXTREMITIES:  No clubbing, cyanosis, or edema.  There is a great toe ulcer at the tip surrounded by some erythema on the left side.  She has decreased sensation to light touch in bilateral feet.  She is able to distinguish touch in her fingers in the upper extremities. MUSCULOSKELETAL:  Her motor strength is 2- at the deltoid, 3- at the biceps, triceps, grip, and 2- in the hip flexor, knee extension, ankle dorsiflexion.  IMPRESSION: 1. Functional deficits secondary to cervical myelopathy,     quadriparesis, and spasticity status post C3-4 ACDF on September 19, 2010. 2. The patient is admitted to receive collaborative interdisciplinary     care between the physiatrist, rehab nursing staff, and therapy     team. 3. The patient's level of medical complexity and  substantial therapy     needs in context of medical necessity cannot be provided at a     lesser intensity of care. 4. The patient has experienced substantial functional loss from her     baseline.  Upon functional assessment at the time of preadmission     screening, the patient was at total assist with all self-care, this     was on September 20, 2010.  Currently, she is total assist bed     mobility, transfer.  She is nonambulatory.  She is max assist upper     body, total assist lower body ADLs.  Judging by the patient's     diagnosis, physical exam, and functional history, the patient has a     potential for functional progress, which will result in measurable     gains while in inpatient rehab.  These gains will be of substantial      and practical use upon discharge to home in facilitating mobility,     self-care.  Interim changes in medical status since preadmission     screening are detailed in the history of present illness. 5. Physiatrist will provide 24-hour management of medical needs as     well as oversight of therapy plan/treatment and provide guidance as     appropriate regarding interactions of the two. 6. A 24-hour rehab nursing will assess in management of skin, bowel,     bladder, and help integrate therapy concepts, techniques, and     education. 7. PT will assess and treat for pregait training, gait training,     endurance with her mobility.  Goals are for min assist level with     mobility, this is likely at a wheelchair level. 8. OT will assess for ADLs, cognitive perceptual skills, equipment,     endurance, safety, neuromuscular reeducation.  Goals are for     supervision upper body and min assist to mod assist lower body     ADLs. 9. Case management and social worker will assess and treat for     psychosocial issues and discharge planning. 10.Team conference will be held weekly to assess the patient     progress/goals and to determine barriers at discharge. 11.The patient has demonstrated sufficient medical stability and     exercise capacity to tolerate at least 3 hours therapy per day at     least 5 days week. 12.Estimated length of stay is 3-4 weeks.  Prognosis for further     functional improvement is good although she did have some     preexisting issues in terms of her mobility and would be helpful     with preadmission functional status.  MEDICAL PROBLEM LIST AND PLAN: 1. DVT prophylaxis, PAS hose, aspirin. 2. Pain management, Vicodin, Robaxin, Neurontin. 3. Hypertension, Norvasc, Lasix, Cozaar. 4. Diabetes, Amaryl, Actos. 5. Hyperlipidemia, Lipitor. 6. Motivation and mood appeared to be adequate for full participation     in inpatient rehabilitation program.  Discussed Rehab  Medicine     Service and answered questions.     Erick Colace, M.D.     AEK/MEDQ  D:  09/22/2010  T:  09/23/2010  Job:  [id number]  cc:   Stefani Dama, M.D. Valerie A. Felicity Coyer, MD  Electronically Signed by Claudette Laws M.D. on 09/28/2010 09:57:33 AM

## 2010-09-29 LAB — CBC
HCT: 32.7 % — ABNORMAL LOW (ref 36.0–46.0)
Platelets: 217 10*3/uL (ref 150–400)
RBC: 3.71 MIL/uL — ABNORMAL LOW (ref 3.87–5.11)
RDW: 15.2 % (ref 11.5–15.5)
WBC: 4.1 10*3/uL (ref 4.0–10.5)

## 2010-09-29 LAB — GLUCOSE, CAPILLARY
Glucose-Capillary: 164 mg/dL — ABNORMAL HIGH (ref 70–99)
Glucose-Capillary: 156 mg/dL — ABNORMAL HIGH (ref 70–99)
Glucose-Capillary: 120 mg/dL — ABNORMAL HIGH (ref 70–99)
Glucose-Capillary: 205 mg/dL — ABNORMAL HIGH (ref 70–99)

## 2010-09-29 LAB — BASIC METABOLIC PANEL
Chloride: 102 mEq/L (ref 96–112)
GFR calc non Af Amer: >60 mL/min (ref >60)
Potassium: 4.1 mEq/L (ref 3.5–5.1)
Sodium: 142 mEq/L (ref 135–145)

## 2010-09-30 LAB — GLUCOSE, CAPILLARY
Glucose-Capillary: 140 mg/dL — ABNORMAL HIGH (ref 70–99)
Glucose-Capillary: 221 mg/dL — ABNORMAL HIGH (ref 70–99)

## 2010-10-01 LAB — GLUCOSE, CAPILLARY
Glucose-Capillary: 108 mg/dL — ABNORMAL HIGH (ref 70–99)
Glucose-Capillary: 114 mg/dL — ABNORMAL HIGH (ref 70–99)

## 2010-10-02 LAB — GLUCOSE, CAPILLARY
Glucose-Capillary: 127 mg/dL — ABNORMAL HIGH (ref 70–99)
Glucose-Capillary: 167 mg/dL — ABNORMAL HIGH (ref 70–99)
Glucose-Capillary: 158 mg/dL — ABNORMAL HIGH (ref 70–99)
Glucose-Capillary: 133 mg/dL — ABNORMAL HIGH (ref 70–99)

## 2010-10-03 DIAGNOSIS — G8250 Quadriplegia, unspecified: Secondary | ICD-10-CM

## 2010-10-03 DIAGNOSIS — M4712 Other spondylosis with myelopathy, cervical region: Secondary | ICD-10-CM

## 2010-10-03 DIAGNOSIS — L97509 Non-pressure chronic ulcer of other part of unspecified foot with unspecified severity: Secondary | ICD-10-CM

## 2010-10-03 LAB — GLUCOSE, CAPILLARY
Glucose-Capillary: 105 mg/dL — ABNORMAL HIGH (ref 70–99)
Glucose-Capillary: 142 mg/dL — ABNORMAL HIGH (ref 70–99)

## 2010-10-04 DIAGNOSIS — G8250 Quadriplegia, unspecified: Secondary | ICD-10-CM

## 2010-10-04 DIAGNOSIS — M4712 Other spondylosis with myelopathy, cervical region: Secondary | ICD-10-CM

## 2010-10-04 LAB — GLUCOSE, CAPILLARY: Glucose-Capillary: 113 mg/dL — ABNORMAL HIGH (ref 70–99)

## 2010-10-05 ENCOUNTER — Inpatient Hospital Stay (HOSPITAL_COMMUNITY): Payer: Medicare Other

## 2010-10-05 ENCOUNTER — Other Ambulatory Visit (HOSPITAL_COMMUNITY): Payer: Medicare Other

## 2010-10-05 DIAGNOSIS — G8250 Quadriplegia, unspecified: Secondary | ICD-10-CM

## 2010-10-05 DIAGNOSIS — M4712 Other spondylosis with myelopathy, cervical region: Secondary | ICD-10-CM

## 2010-10-05 LAB — GLUCOSE, CAPILLARY
Glucose-Capillary: 142 mg/dL — ABNORMAL HIGH (ref 70–99)
Glucose-Capillary: 135 mg/dL — ABNORMAL HIGH (ref 70–99)

## 2010-10-06 DIAGNOSIS — M4712 Other spondylosis with myelopathy, cervical region: Secondary | ICD-10-CM

## 2010-10-06 DIAGNOSIS — G8250 Quadriplegia, unspecified: Secondary | ICD-10-CM

## 2010-10-06 LAB — GLUCOSE, CAPILLARY
Glucose-Capillary: 126 mg/dL — ABNORMAL HIGH (ref 70–99)
Glucose-Capillary: 160 mg/dL — ABNORMAL HIGH (ref 70–99)
Glucose-Capillary: 159 mg/dL — ABNORMAL HIGH (ref 70–99)
Glucose-Capillary: 158 mg/dL — ABNORMAL HIGH (ref 70–99)

## 2010-10-07 LAB — GLUCOSE, CAPILLARY: Glucose-Capillary: 197 mg/dL — ABNORMAL HIGH (ref 70–99)

## 2010-10-08 LAB — GLUCOSE, CAPILLARY
Glucose-Capillary: 98 mg/dL (ref 70–99)
Glucose-Capillary: 188 mg/dL — ABNORMAL HIGH (ref 70–99)

## 2010-10-09 LAB — GLUCOSE, CAPILLARY
Glucose-Capillary: 102 mg/dL — ABNORMAL HIGH (ref 70–99)
Glucose-Capillary: 197 mg/dL — ABNORMAL HIGH (ref 70–99)
Glucose-Capillary: 297 mg/dL — ABNORMAL HIGH (ref 70–99)

## 2010-10-10 LAB — GLUCOSE, CAPILLARY
Glucose-Capillary: 163 mg/dL — ABNORMAL HIGH (ref 70–99)
Glucose-Capillary: 143 mg/dL — ABNORMAL HIGH (ref 70–99)

## 2010-10-11 LAB — GLUCOSE, CAPILLARY
Glucose-Capillary: 265 mg/dL — ABNORMAL HIGH (ref 70–99)
Glucose-Capillary: 224 mg/dL — ABNORMAL HIGH (ref 70–99)

## 2010-10-12 LAB — GLUCOSE, CAPILLARY: Glucose-Capillary: 198 mg/dL — ABNORMAL HIGH (ref 70–99)

## 2010-10-18 NOTE — Op Note (Signed)
[name], [name]              ACCOUNT NO.:  [id number]  MEDICAL RECORD NO.:  [id number]           PATIENT TYPE:  I  LOCATION:  3037                         FACILITY:  [facility]  PHYSICIAN:  Stefani Dama, M.D.  DATE OF BIRTH:  [birth year]  DATE OF PROCEDURE:  09/19/2010 DATE OF DISCHARGE:                              OPERATIVE REPORT   PREOPERATIVE DIAGNOSIS:  Cervical spondylosis and herniated nucleus pulposus at C3-C4 with myelopathy.  POSTOPERATIVE DIAGNOSIS:  Cervical spondylosis and herniated nucleus pulposus at C3-C4 with myelopathy.  PROCEDURE:  Anterior cervical decompression at C3-C4, arthrodesis with structural allograft, and Alphatec plate fixation at C3-C4.  SURGEON:  Stefani Dama, MD  FIRST ASSISTANT:  Hewitt Shorts, MD  ANESTHESIA:  General endotracheal.  INDICATIONS:  Ashley Fritz is an 77 year old individual who has progressively lost use of her hands and has had difficulty with her gait for a number of months.  Workup included an MRI of the cervical spine which demonstrated presence of a large extruded fragment of disk at C3- C4 on top of a moderately spondylitic neck with significant bony ridges at multiple levels but severe cord compression at the level of C3-C4 and intrinsic cord changes noted on the MRI.  The patient was losing use of her hand function such that she was beginning to require help with activities of daily living including feeding herself.  She was advised regarding surgical decompression of this process.  PROCEDURE:  The patient was brought to the operating room supine on the stretcher.  After smooth induction of general endotracheal anesthesia, she was placed in 5 pounds of Halter traction.  Neck was prepped and draped in the usual sterile fashion.  Transverse incision was made in the left side of the neck and this was carried down through the platysma.  The plane between the sternocleidomastoid and the strap muscles was  dissected bluntly until prevertebral space was reached and the first identifiable disk space was noted to be that of C2-C3. Dissection was carried inferiorly, and the longus colli muscle was stripped off either side of midline to allow placement of a self- retaining Agricultural engineer.  The anterior longitudinal ligament was then opened with a 15 blade and a combination of curettes and rongeurs were used to enter the disk space.  Significant overhanging osteophyte from the inferior margins of the C3 was taken down with a Leksell rongeur and a Beyer rongeur was used similarly to open the space further.  High- speed bur and 2-mm dissecting tool was used to enter the disk space and remove significant bony overgrowth from the inferior margin of body of the C3 and irregularities on the superior lip of C4.  As the disk space was opened further, region of posterior longitudinal ligament was encountered.  Here there was severely degenerated and very desiccated disk material and had a brownish discoloration to it.  That was in the subligamentous space behind the vertebral body.  This was dissected out with a small nerve hook and a combination of rongeurs.  Ultimately, the posterior longitudinal ligament was identified and this was opened with an 1-mm Kerrison punch and  then enlarged with 2-mm Kerrison punch.  The dissection yielded good decompression of the canal centrally and out to the lateral recess.  Uncinate spurs on either side were drilled down with a high-speed bur.  In the end, hemostasis was achieved from the lateral gutters in the region of the exiting nerve roots to the central portion of the canal.  The decompression being completed, a 7-mm TranZgraft was shaved, sized appropriately, and the graft was filled with demineralized bone matrix and placed into the interspace.  A 12-mm standard size Alphatec plate was then fitted to the ventral aspect of vertebral bodies and secured with four  12-mm variable angle screws. These were locked into place, and final radiograph was obtained to identify good position of the surgical construct.  Hemostasis in the soft tissues was then meticulously obtained, and then the platysma was closed with 3-0 Vicryl in an interrupted fashion and 3-0 Vicryl was used in the subcuticular tissues and Dermabond was placed on the skin.  Blood loss for the procedure was estimated less than 20 mL.     Stefani Dama, M.D.     Merla Riches  D:  09/19/2010  T:  09/20/2010  Job:  [id number]  Electronically Signed by Barnett Abu M.D. on 10/18/2010 09:22:10 AM

## 2010-10-19 NOTE — Discharge Summary (Signed)
Ashley Fritz, Ashley Fritz              ACCOUNT NO.:  [id number]  MEDICAL RECORD NO.:  [id number]           PATIENT TYPE:  I  LOCATION:  4037                         FACILITY:  [facility]  PHYSICIAN:  Ranelle Oyster, M.D.DATE OF BIRTH:  05/24/1927  DATE OF ADMISSION:  09/22/2010 DATE OF DISCHARGE:  10/12/2010                              DISCHARGE SUMMARY   DISCHARGE DIAGNOSES: 1. Cervical 3-4 spondylosis with herniated nucleus pulposus -     myelopathy. 2. Pulsatile stockings for deep vein thrombosis prophylaxis, pain     management, hypertension, diabetes mellitus, hyperlipidemia,     urinary retention, and left toe as well as right heel ulcer.  A 77 year old black female admitted February 13 with progressive weakness to the hands with decreased ambulation.  MRI imaging showed cervical spondylosis, HNP with cervical 3-4 myelopathy.  Underwent ACDF of cervical 3-4 February 13 by Dr. Danielle Dess.  Fitted with cervical collar. Noted some coughing with medications with followup per Speech Therapy to be completed.  Followup cervical spine films February 15 with some postoperative soft tissue swelling, no current plan for Decadron and monitored.  Total assist for mobility, the patient was admitted for comprehensive rehab program.  PAST MEDICAL HISTORY:  Hypertension, cerebrovascular accident in 2009, hyperlipidemia, non-insulin diabetes mellitus.  PAST SURGICAL HISTORY:  She has had appendectomy and oophorectomy.  No alcohol or tobacco.  ALLERGIES:  None.  SOCIAL HISTORY:  Lives alone.  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  The patient had been at a skilled nursing facility August 2011 for weakness and falls and later returned home.  She lives in a 1-level home.  FUNCTIONAL HISTORY PRIOR TO ADMISSION:  She used a power scooter and a walker.  Functional status upon admission to rehab services was total assist bed mobility and transfers,  ambulation not tested, max assist upper body and total assist lower body activities daily living.  MEDICATIONS PRIOR TO ADMISSION: 1. Losartan 50 mg daily. 2. Neurontin 300 mg at bedtime. 3. Norvasc 5 mg daily. 4. Lipitor 20 mg daily. 5. Lasix 20 mg daily. 6. Aspirin 81 mg daily. 7. Amaryl 4 mg daily. 8. Actos 15 mg daily.  PHYSICAL EXAMINATION:  VITAL SIGNS:  Blood pressure 120/72, pulse 70, temperature 99, respirations 20. GENERAL:  This was an alert female in no acute distress, oriented x3. Decreased vision to the right eye. NEUROLOGIC:  Deep tendon reflex is hypoactive.  Cervical collar in place.  Sensation decreased to light touch distally.  Surgical site clean, dry and intact. LUNGS:  Decreased breath sounds, clear to auscultation. CARDIAC:  Regular rate and rhythm. ABDOMEN:  Soft, nontender.  Good bowel sounds.  REHABILITATION HOSPITAL COURSE:  The patient was admitted to inpatient rehab services with therapies initiated on a 3-hour daily basis consisting of physical therapy, occupational therapy, speech therapy, and rehabilitation nursing.  The following issues were addressed during the patient's rehabilitation stay.  Pertaining to [name] cervical spondylosis with myelopathy, she had undergone ACDF on February 13 per Dr. Danielle Dess.  Surgical site healing nicely.  A cervical collar remained in place  at all times.  Pulsatile stockings were used for deep vein thrombosis prophylaxis as well as remaining on aspirin therapy.  Pain management with use of Neurontin which was adjusted accordingly as well as Vicodin and Robaxin for as needed use.  Her blood pressures remained monitored on Norvasc, Lasix, and Cozaar with no orthostatic changes. She did have a history of diabetes mellitus.  Blood sugars overall well controlled on Amaryl 8 mg daily as well as Actos 15 mg twice daily.  She continued on her Lipitor for hyperlipidemia.  During her rehabilitation course, noted bouts  of urinary retention.  A urinalysis study showed no growth.  She was placed on Urecholine as well as intermittent catheterizations as needed.  She did have some intermittent spontaneous voids with incontinent episodes.  Her Urecholine was discontinued on October 10, 2010 to help lessen her incontinence.  Latest note shows the patient did void 325 mL of bladder scan of 281 mL.  This was repeated after a void of 350 and catheterization of 425.  She was receiving intermittent catheterizations every 6 hours if volumes of greater than 300.  She did have a left toe ulcer as well as right heel ulcers.  She received followup by the wound care nurse with Aquacel placed to the right heel daily.  Site was protected with Mepilex border.  A small piece of Aquacel was applied to the left great toe changed as needed. On February 27, ABIs were completed.  It showed a moderate reduction arterial flow in the right, normal ABIs on the left.  The patient received weekly collaborative interdisciplinary team conferences to discuss estimated length of stay, family teaching, and any barriers to discharge with max total assist upper body dressing as well as lower body dressing, moderate to max assist bedside commode, minimal assist for level transfers bed to wheelchair and supervision for dynamic sitting balance.  She was able to propel her wheelchair with supervision, although her endurance was limited.  Due to her slow progressive gains and limited assistance at home, it was felt skilled nursing facility would be needed with bed becoming available on October 12, 2010 and discharge taking place at that time.  Latest labs showed a sodium 142, potassium 4.1, BUN 9, creatinine 0.5, hemoglobin 10.4, hematocrit 32.7, platelet 217,000, WBC of 4.1.  DISCHARGE MEDICATIONS AT TIME OF DICTATION: 1. Norvasc 5 mg p.o. daily. 2. Aspirin 81 mg p.o. daily. 3. Lasix 20 mg p.o. daily. 4. Neurontin 100 mg p.o. twice daily at 8:00  a.m. and 1400 hour, 300     mg at bedtime. 5. Cozaar 50 mg p.o. daily. 6. Actos 15 mg p.o. b.i.d. 7. Lipitor 20 mg p.o. daily. 8. Voltaren gel 1% applied 3 times daily to bilateral knees as needed. 9. Dulcolax suppository per rectum daily as needed. 10.Amaryl 8 mg p.o. daily. 11.Florastor 500 mg p.o. b.i.d. 12.Robaxin 500 mg p.o. every 6 hours as needed spasms. 13.Vicodin 5/325, 1 or 2 tablets every 4 hours as needed for pain.  Her diet was a dysphagia III thin liquid diet, 1800 calories ADA. Medications could be whole and given in puree.  Please provide staff supervision with meals and set up for assistance.  Special instructions were cervical collar at all times, intermittent catheterizations every 6 hours as needed if no void.  Skin wound instructions include Aquacel skin prep to the right heel daily.  May protect site with Mepilex border which peeled back and apply skin prep and reapply, change every 5 days are  as needed or leave open to air if the patient desires.  Also please apply a small piece of Aquacel and apply to left great toe, cover with tape, moisten with normal saline and cover with dry 4x4, change daily as needed.  The patient should continue therapies to promote overall mobility and well-being.  Follow up with Dr. Faith Rogue at the outpatient rehab service office as directed, Dr. Barnett Abu, neurosurgery [phone], call for appointment.     Mariam Dollar, P.A.   ______________________________ Ranelle Oyster, M.D.    DA/MEDQ  D:  10/11/2010  T:  10/11/2010  Job:  [id number]  cc:   Ranelle Oyster, M.D. Valerie A. Felicity Coyer, MD Stefani Dama, M.D.  Electronically Signed by Mariam Dollar P.A. on 10/12/2010 07:03:22 AM Electronically Signed by Faith Rogue M.D. on 10/19/2010 10:22:57 AM

## 2010-10-25 LAB — COMPREHENSIVE METABOLIC PANEL
ALT: 21 U/L (ref 0–35)
Albumin: 3.1 g/dL — ABNORMAL LOW (ref 3.5–5.2)
Alkaline Phosphatase: 86 U/L (ref 39–117)
BUN: 13 mg/dL (ref 6–23)
BUN: 23 mg/dL (ref 6–23)
CO2: 30 mEq/L (ref 19–32)
CO2: 32 mEq/L (ref 19–32)
Calcium: 8.8 mg/dL (ref 8.4–10.5)
Chloride: 104 mEq/L (ref 96–112)
Creatinine, Ser: 0.74 mg/dL (ref 0.4–1.2)
GFR calc Af Amer: >60 mL/min (ref >60)
GFR calc non Af Amer: >60 mL/min (ref >60)
GFR calc non Af Amer: >60 mL/min (ref >60)
Glucose, Bld: 120 mg/dL — ABNORMAL HIGH (ref 70–99)
Potassium: 3.8 mEq/L (ref 3.5–5.1)
Sodium: 142 mEq/L (ref 135–145)
Total Bilirubin: 0.3 mg/dL (ref 0.3–1.2)
Total Bilirubin: 0.4 mg/dL (ref 0.3–1.2)
Total Protein: 5.7 g/dL — ABNORMAL LOW (ref 6.0–8.3)

## 2010-10-25 LAB — DIFFERENTIAL
Basophils Absolute: 0 10*3/uL (ref 0.0–0.1)
Basophils Absolute: 0 10*3/uL (ref 0.0–0.1)
Basophils Absolute: 0 10*3/uL (ref 0.0–0.1)
Basophils Relative: 0 % (ref 0–1)
Basophils Relative: 0 % (ref 0–1)
Eosinophils Absolute: 0 10*3/uL (ref 0.0–0.7)
Eosinophils Absolute: 0.1 10*3/uL (ref 0.0–0.7)
Eosinophils Relative: 1 % (ref 0–5)
Lymphocytes Relative: 32 % (ref 12–46)
Lymphs Abs: 1.9 10*3/uL (ref 0.7–4.0)
Monocytes Absolute: 0.5 10*3/uL (ref 0.1–1.0)
Monocytes Relative: 10 % (ref 3–12)
Monocytes Relative: 12 % (ref 3–12)
Neutro Abs: 2.7 10*3/uL (ref 1.7–7.7)
Neutro Abs: 3.2 10*3/uL (ref 1.7–7.7)
Neutrophils Relative %: 51 % (ref 43–77)

## 2010-10-25 LAB — CBC
HCT: 29.1 % — ABNORMAL LOW (ref 36.0–46.0)
HCT: 29.5 % — ABNORMAL LOW (ref 36.0–46.0)
HCT: 30.2 % — ABNORMAL LOW (ref 36.0–46.0)
Hemoglobin: 10 g/dL — ABNORMAL LOW (ref 12.0–15.0)
Hemoglobin: 9.6 g/dL — ABNORMAL LOW (ref 12.0–15.0)
Hemoglobin: 9.7 g/dL — ABNORMAL LOW (ref 12.0–15.0)
MCHC: 31.8 g/dL (ref 30.0–36.0)
MCHC: 32 g/dL (ref 30.0–36.0)
MCHC: 32.5 g/dL (ref 30.0–36.0)
MCV: 90.6 fL (ref 78.0–100.0)
MCV: 90.8 fL (ref 78.0–100.0)
MCV: 91.1 fL (ref 78.0–100.0)
Platelets: 182 10*3/uL (ref 150–400)
RBC: 3.3 MIL/uL — ABNORMAL LOW (ref 3.87–5.11)
RBC: 3.46 MIL/uL — ABNORMAL LOW (ref 3.87–5.11)
RDW: 15.5 % (ref 11.5–15.5)
RDW: 15.8 % — ABNORMAL HIGH (ref 11.5–15.5)
RDW: 15.8 % — ABNORMAL HIGH (ref 11.5–15.5)
WBC: 5.8 10*3/uL (ref 4.0–10.5)

## 2010-10-25 LAB — GLUCOSE, CAPILLARY
Glucose-Capillary: 143 mg/dL — ABNORMAL HIGH (ref 70–99)
Glucose-Capillary: 153 mg/dL — ABNORMAL HIGH (ref 70–99)
Glucose-Capillary: 157 mg/dL — ABNORMAL HIGH (ref 70–99)
Glucose-Capillary: 192 mg/dL — ABNORMAL HIGH (ref 70–99)
Glucose-Capillary: 215 mg/dL — ABNORMAL HIGH (ref 70–99)
Glucose-Capillary: 52 mg/dL — ABNORMAL LOW (ref 70–99)
Glucose-Capillary: 62 mg/dL — ABNORMAL LOW (ref 70–99)

## 2010-10-25 LAB — URINALYSIS, ROUTINE W REFLEX MICROSCOPIC
Bilirubin Urine: NEGATIVE
Glucose, UA: NEGATIVE mg/dL
Hgb urine dipstick: NEGATIVE
Protein, ur: NEGATIVE mg/dL

## 2010-10-25 LAB — BASIC METABOLIC PANEL
CO2: 30 mEq/L (ref 19–32)
Chloride: 105 mEq/L (ref 96–112)
Glucose, Bld: 153 mg/dL — ABNORMAL HIGH (ref 70–99)
Potassium: 4 mEq/L (ref 3.5–5.1)
Sodium: 140 mEq/L (ref 135–145)

## 2010-10-25 LAB — T4: T4, Total: 7.5 ug/dL (ref 5.0–12.5)

## 2010-10-25 LAB — POCT I-STAT, CHEM 8
BUN: 32 mg/dL — ABNORMAL HIGH (ref 6–23)
Calcium, Ion: 1.15 mmol/L (ref 1.12–1.32)
Chloride: 105 mEq/L (ref 96–112)
Creatinine, Ser: 0.8 mg/dL (ref 0.4–1.2)
Glucose, Bld: 141 mg/dL — ABNORMAL HIGH (ref 70–99)
TCO2: 30 mmol/L (ref 0–100)

## 2010-10-25 LAB — POCT CARDIAC MARKERS
Myoglobin, poc: 69.9 ng/mL (ref 12–200)
Troponin i, poc: <0.05 ng/mL (ref 0.00–0.09)
Troponin i, poc: <0.05 ng/mL (ref 0.00–0.09)

## 2010-10-25 LAB — MAGNESIUM: Magnesium: 1.9 mg/dL (ref 1.5–2.5)

## 2010-10-25 LAB — URINE CULTURE: Colony Count: NO GROWTH

## 2010-10-25 LAB — VITAMIN B12: Vitamin B-12: 490 pg/mL (ref 211–911)

## 2010-10-25 LAB — CARDIAC PANEL(CRET KIN+CKTOT+MB+TROPI): CK, MB: 1 ng/mL (ref 0.3–4.0)

## 2010-10-25 LAB — CK TOTAL AND CKMB (NOT AT ARMC)
CK, MB: 1.7 ng/mL (ref 0.3–4.0)
Total CK: 120 U/L (ref 7–177)

## 2010-10-25 LAB — TROPONIN I: Troponin I: 0.02 ng/mL (ref 0.00–0.06)

## 2010-10-25 LAB — HEMOGLOBIN A1C
Hgb A1c MFr Bld: 7.3 % — ABNORMAL HIGH (ref <5.7)
Mean Plasma Glucose: 163 mg/dL — ABNORMAL HIGH (ref <117)

## 2010-10-25 LAB — FOLATE: Folate: 12.1 ng/mL

## 2010-11-21 NOTE — Discharge Summary (Signed)
Ashley Fritz, Ashley Fritz              ACCOUNT NO.:  [id number]  MEDICAL RECORD NO.:  [id number]           PATIENT TYPE:  I  LOCATION:  3037                         FACILITY:  [facility]  PHYSICIAN:  Stefani Dama, M.D.  DATE OF BIRTH:  1926-10-02  DATE OF ADMISSION:  09/19/2010 DATE OF DISCHARGE:  09/22/2010                              DISCHARGE SUMMARY   ADMITTING DIAGNOSIS:  Cervical spondylosis and herniated nucleus pulposus C3-4 with myelopathy.  DISCHARGE DIAGNOSIS:  Cervical spondylosis and herniated nucleus pulposus C3-4 with myelopathy.  OPERATIONS AND PROCEDURES:  Anterior cervical decompression and fusion at C3-4 with arthrodesis and after that plate fixation at C3-4.  BRIEF HISTORY AND HOSPITAL COURSE:  The patient is an 77 year old female who has had progressive loss of use of her upper extremities and hands, difficulty with her gait for a number of months.  Workup included an MRI of cervical spine with some areas of large extruded fragment of disk at 3-4 on top of spondylitic neck with intrinsic cord changes consistent with cord compression.  It was recommended that she undergo surgical decompression for this process.  She has had increasing difficulties with activities of daily living including feeding herself.  She tolerated surgery on September 19, 2010, was stabilized in recovery room, placed on IV pain medicine and p.o. pain medicine.  First day postoperatively her legs were feeling little bit better.  She still was substantially weak.  Vital signs were stable, afebrile.  She did have a small ulceration on her left great toe, which was closed.  There was no drainage, slight improvement in her upper extremity strength, and decreased amount of numbness, however, she is still having dysesthesias. Wound was benign, there was no signs of infection.  Comprehensive inpatient rehabilitation consult was obtained.  Continue physical therapy and occupational therapy.  Second  day postoperatively, the patient felt like she had increased amount of swelling in her throat, no distress, although she did have little bit difficulty swallowing.  We gave her some softer foods, small bites.  We held off on Decadron due to the fact that she is a diabetic.  She was ready for transfer to rehab when bed was available.  Inpatient rehab anticipated that she would do well with stay with them.  She was ready for discharge to rehab on September 12, 2010.  She was eating well, feeling better, decreased swelling in her throat, increased strength in her legs, still substantial strength deficits overall in her legs and upper extremities. X-rays showed some prevertebral swelling, there was no respiratory distress.  Vital signs were stable.  Hemoglobin A1c was 8.9.  DISCHARGE CONDITION:  Stable, improved.  DISCHARGE INSTRUCTIONS:  Transfer to comprehensive inpatient rehab today.  Continue with therapies.  Continue on her home medications, 1. Gabapentin 300 mg at bedtime. 2. Diclofenac was going to be put on hold.3. Furosemide 20 mg p.o. daily. 4. Losartan 50 mg p.o. daily. 5. Lipitor 20 mg p.o. daily. 6. Glimepiride 4 mg p.o. daily. 7. Aspirin 81 mg one p.o. daily. 8. Amlodipine 5 mg one p.o. daily. 9. Actos 15 mg one p.o. daily. 10.Vicodin or Percocet for  pain control.  Contact our office prior to followup with concerns.  Soft cervical collar for comfort and protection and safety.  Continue with physical therapy, occupational therapy, and discharged home after she recovers during her therapy in comprehensive inpatient rehab.  All questions encouraged and addressed.  The patient discharged to CIR today in stable condition.     Ashley Fritz Medico.   ______________________________ Stefani Dama, M.D.    SCI/MEDQ  D:  11/03/2010  T:  11/04/2010  Job:  [id number]  Electronically Signed by Velora Heckler. on 11/10/2010 12:51:31 PM Electronically Signed by Barnett Abu  M.D. on 11/21/2010 10:11:34 AM

## 2010-11-30 DIAGNOSIS — M6281 Muscle weakness (generalized): Secondary | ICD-10-CM

## 2010-11-30 HISTORY — DX: Muscle weakness (generalized): M62.81
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
## 2010-12-20 NOTE — Discharge Summary (Signed)
Ashley Fritz, [name]              ACCOUNT NO.:  [id number]   MEDICAL RECORD NO.:  [id number]          PATIENT TYPE:  INP   LOCATION:  1426                         FACILITY:  [facility]   PHYSICIAN:  Isidor Holts, M.D.  DATE OF BIRTH:  Apr 13, 1927   DATE OF ADMISSION:  07/19/2008  DATE OF DISCHARGE:  07/24/2008                               DISCHARGE SUMMARY   DISCHARGE DIAGNOSES:  1. Acute cerebrovascular accident, left caudate nucleus.  2. Vertigo.  3. Hypertension.  4. Diabetes type 2.  5. Dyslipidemia.  6. Osteoarthritis.   DISCHARGE MEDICATIONS:  1. Amaryl 4 mg p.o. daily.  2. Aspirin p.r.n. 325 mg p.o. daily.  3. Cozaar 50 mg p.o. daily.  4. Meclizine 25 mg p.o. t.i.d.  5. Lipitor 20 mg p.o. daily.  6. Os-Cal with vitamin D p.o. daily.  7. Fish oil.   PROCEDURES:  1. The patient had a chest x-ray on July 19, 2008 which showed      cardiac enlargement and chronic lung changes.  2. Head CT on July 20, 2008 which showed acute intracranial      findings.  3. The patient had brain MRI on July 20, 2008 which showed small      area of acute infarct in the body of the caudate nucleus.  4. The patient had MRA dated July 20, 2008 which showed moderate      narrowing of the left cavernous sinus.  5. The patient had a bilateral carotid vertebral artery duplex scan,      dated July 21, 2008.  This showed no significant left or right      ICA stenosis.  6. The patient had a 2-D echocardiogram July 22, 2008 which showed      poor image, lateral wall appears hypokinetic, left ventricle was      mildly dilated while left ventricular systolic function was mildly      decreased.   BRIEF HISTORY OF PRESENT ILLNESS:  The patient is a 77 year old female  with known history of type 2 diabetes, chronic dyslipidemia,  hypertension, and retinopathy.  She presented with dizziness of sudden  onset, associated with unsteady gait -- transient in nature.  CVA workup  was  initiated.  The patient underwent a brain MRI.  The patient's brain  MRI demonstrated an acute infarction of the left caudate nucleus,  necessitating a neurological consultation.  Routine recommendations were  instituted including:  aspirin, 2-D echocardiogram, physical therapy and  occupational therapy for gait and balance.  The patient was resumed on  meclizine.   1. For type 2 diabetes the patient remainedeuglycemic throughout the      hospitalization.  The patient was initiated on sliding-scale      insulin for coverage.  The patient was continued on Amaryl.  2. Hypertension:  Remained normotensive throughout the admission.  3. Dyslipidemia:  Total cholesterol was 123, LDL 68,   DISPOSITION:  The patient will be discharged to home.   DISCHARGE DIET:  The patient will have a carbohydrate modified diet.   DISCHARGE ACTIVITIES:  As tolerated.  Physical therapy and occupational  therapy.  FOLLOWUP:  Primary care doctor at [facility].  In addition, she will  follow up with [facility] (telephone number [phone]).      Ashley Ferrara, MD      ______________________________  Isidor Holts, M.D.    RR/MEDQ  D:  10/08/2008  T:  10/08/2008  Job:  [id number]
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
## 2010-12-20 NOTE — Consult Note (Signed)
Ashley Fritz, Ashley Fritz              ACCOUNT NO.:  [id number]   MEDICAL RECORD NO.:  [id number]          PATIENT TYPE:  INP   LOCATION:  5507                         FACILITY:  [facility]   PHYSICIAN:  Veverly Fells. Excell Seltzer, MD  DATE OF BIRTH:  Aug 23, 1926   DATE OF CONSULTATION:  12/10/2007  DATE OF DISCHARGE:                                 CONSULTATION   REQUESTING PHYSICIAN:  Isidor Holts, M.D.   REASON FOR CONSULTATION:  Bradycardia.   HISTORY OF PRESENT ILLNESS:  Ashley Fritz is a delightful 77 year old  African-American woman who was admitted for dizziness.  The patient on  admission May 2 complained of constant dizziness over the preceding 48  hours.  She denied syncope.  She has not had similar symptoms in the  past.  She has a difficult time ascertaining whether this was true  dizziness versus lightheadedness.  She did not lose vision or have any  focal neurologic symptoms.  She had no associated symptoms of  palpitations, chest pain or dyspnea.  She was admitted to the hospital  and treated for mild dehydration.  Her heart rate over the first day of  admission was in the 60s and 70s throughout.  However, since then, she  has had episodes of bradycardia with heart rate in the 50s as well as  frequent PVCs.  We were asked to see her for evaluation of bradycardia  and the possibility that this is contributing to her symptoms.   PAST MEDICAL HISTORY:  1. Longstanding type 2 diabetes.  2. Essential hypertension.  3. Dyslipidemia.  4. Osteoarthritis  5. Diabetic retinopathy.   MEDICATIONS:  1. Enoxaparin for DVT prophylaxis 40 mg at bedtime subcutaneously.  2. Aspirin 81 mg daily.  3. Protonix 40 mg daily.  4. Simvastatin 40 mg daily.  5. Insulin sliding scale.  6. Glyburide 10 mg daily.  7. Zofran as needed.  8. Acetaminophen as needed.   HOME MEDICATIONS:  Have included:  1. Lasix 20 mg daily.  2. Cozaar 50 mg daily.  3. Glyburide/metformin.  4. Amlodipine.   ALLERGIES:  No known drug allergies.   SOCIAL HISTORY:  The patient lives alone.  She is originally from [place].  She is retired and used to own a Production assistant, radio.  She also has  training in nursing.  She does not drink alcohol or smoke cigarettes and  has no history of either.   FAMILY HISTORY:  Mother died at age 24 from complications of diabetes.  Father died at age 21. No history of coronary artery disease in the  family.   REVIEW OF SYSTEMS:  Complete 12-point Review of Systems was performed.  Pertinent positives included constipation, mild chronic exertional  dyspnea and low back pain.  All other systems were reviewed and are  negative except as detailed above.   PHYSICAL EXAMINATION:  GENERAL:  The patient is alert and oriented.  She  is in no acute distress. She is an elderly woman.  VITAL SIGNS:  Temperature 97.0, heart rate 62, respiratory rate 20,  oxygen saturation 95% on room air, blood pressure 134/72.  HEENT:  Normal.  NECK:  Normal carotid upstrokes with bilateral bruits, left greater than  right.  Jugular venous pressure is normal.  No thyromegaly or thyroid  nodules.  LUNGS:  Clear to auscultation bilaterally.  CARDIOVASCULAR:  The heart regular rate and rhythm.  The apex is  discreet and nondisplaced.  There is no right ventricular heave or lift.  There is a 2/6 systolic ejection murmur heard throughout the left  sternal border and right upper sternal border.  There are no diastolic  murmurs or gallops.  ABDOMEN:  Soft, obese, nontender, no organomegaly, no abdominal bruits.  BACK:  No CVA tenderness.  EXTREMITIES:  No clubbing, cyanosis or edema.  Peripheral pulses are 2+  and equal throughout.  SKIN:  Warm and dry without rash.  NEUROLOGIC:  Cranial nerves II-XII are intact.  Strength 5/5 and equal  in the arms and legs.   EKG shows sinus rhythm with left intraventricular conduction delay.  Frequent PVCs are noted. There is left axis deviation noted    Telemetry was reviewed and demonstrated predominately sinus rhythm with  some episodes of bradycardia but no heart rates below 50 beats per  minute.  There were PVCs present with short runs of ventricular  bigemini.  There were no significant pauses or complex arrhythmias  noted.   ASSESSMENT:  1. Sinus bradycardia with frequent PVCs.  2. Dizziness.  3. Type 2 diabetes.  4. Essential hypertension.   At this point, her cardiac rhythm does not appear to correlate with her  symptoms.  The patient presented with ongoing dizziness and at that time  had a heart rate in the 70s.  She has had PVCs, but they do not appear  to be symptomatic.  In reviewing her 12-lead EKG and telemetry, I do not  see any indications for permanent pacemaker.  I clearly would recommend  avoiding any AV nodal blockers since she has first-degree AV block with  PR interval of 240 milliseconds as well as an intraventricular  conduction delay.  Would recommend carotid ultrasound for evaluation of  carotid bruit.  This may be a transmitted heart murmur as well.  I will  review her echocardiogram soon as it is available.  If LV function is  normal, I would not recommend pursuing any further cardiac workup at  this point.   Thank you for the opportunity to participate in the care Ms. Glacken.  Please feel free to call at anytime with questions.   Sincerely,      Veverly Fells. Excell Seltzer, MD  Electronically Signed     MDC/MEDQ  D:  12/10/2007  T:  12/10/2007  Job:  [id number]

## 2010-12-20 NOTE — Discharge Summary (Signed)
Ashley Fritz, Ashley Fritz              ACCOUNT NO.:  [id number]   MEDICAL RECORD NO.:  [id number]          PATIENT TYPE:  INP   LOCATION:  1426                         [facility]ACILITY:  [facility]   PHYSICIAN:  [name], MD         DATE OF BIRTH:  11/03/1926   DATE OF ADMISSION:  07/19/2008  DATE OF DISCHARGE:  07/24/2008                               DISCHARGE SUMMARY   ADDENDUM TO DISCHARGE SUMMARY BY DR. Brien Few, 07/23/2009   DISCHARGE DIAGNOSES:  1. Acute cerebrovascular accident, left caudate nucleus.  2. Vertigo.  3. Hypertension.  4. Diabetes type 2.  5. Dyslipidemia.  6. Osteoarthritis.   DISCHARGE MEDICATIONS:  1. Amaryl 4 mg p.o. daily.  2. Aspirin p.r.n. 325 mg p.o. daily.  3. Cozaar 50 mg p.o. daily.  4. Meclizine 25 mg p.o. t.i.d.  5. Lipitor 20 mg p.o. daily.  6. Os-Cal with vitamin D p.o. daily.  7. Fish oil.   PROCEDURES:  1. The patient had a chest x-ray on July 19, 2008 which showed      cardiac enlargement and chronic lung changes.  2. Head CT on July 20, 2008 which showed acute intracranial      findings.  3. The patient had brain MRI on July 20, 2008 which showed small      area of acute infarct in the body of the caudate nucleus.  4. The patient had MRA dated July 20, 2008 which showed moderate      narrowing of the left cavernous sinus.  5. The patient had a bilateral carotid vertebral artery duplex scan,      dated July 21, 2008.  This showed no significant left or right      ICA stenosis.  6. The patient had a 2-D echocardiogram July 22, 2008 which showed      poor image, lateral wall appears hypokinetic, left ventricle was      mildly dilated while left ventricular systolic function was mildly      decreased.   BRIEF HISTORY OF PRESENT ILLNESS:  The patient is a 77 year old female  with known history of type 2 diabetes, chronic dyslipidemia,  hypertension, and retinopathy.  She presented with dizziness of sudden  onset, associated with  unsteady gait -- transient in nature.  CVA workup  was initiated.  The patient underwent a brain MRI.  The patient's brain  MRI demonstrated an acute infarction of the left caudate nucleus,  necessitating a neurological consultation.  Routine recommendations were  instituted including:  aspirin, 2-D echocardiogram, physical therapy and  occupational therapy for gait and balance.  The patient was resumed on  meclizine.   1. For type 2 diabetes the patient remainedeuglycemic throughout the      hospitalization.  The patient was initiated on sliding-scale      insulin for coverage.  The patient was continued on Amaryl.  2. Hypertension:  Remained normotensive throughout the admission.  3. Dyslipidemia:  Total cholesterol was 123, LDL 68,   DISPOSITION:  The patient will be discharged to home.   DISCHARGE DIET:  The patient will have a  carbohydrate modified diet.   DISCHARGE ACTIVITIES:  As tolerated.  Physical therapy and occupational  therapy.   FOLLOWUP:  Primary care doctor at [facility].  In addition, she will  follow up with [facility] (telephone number [phone]).      [name], MD     RR/MEDQ  D:  10/08/2008  T:  12/21/2008  Job:  [id number]
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
## 2010-12-20 NOTE — Discharge Summary (Signed)
Ashley Fritz, Ashley Fritz              ACCOUNT NO.:  [id number]   MEDICAL RECORD NO.:  [id number]          PATIENT TYPE:  INP   LOCATION:  5504                         FACILITY:  [facility]   PHYSICIAN:  Mobolaji B. Bakare, M.D.DATE OF BIRTH:  09-01-1926   DATE OF ADMISSION:  12/07/2007  DATE OF DISCHARGE:  12/12/2007                               DISCHARGE SUMMARY   ADDENDUM   PRIMARY CARE PHYSICIAN:  Karene Fry Day, MD at [facility]   The patient was waiting for evaluation by cardiology in view of the  brady arrhythmia.  The patient was seen in consultation by Dr. Excell Seltzer  for bradycardia and he opined that.  The patient does have sinus  bradycardia with frequent PVCs but this are not sufficient to account  for her symptoms of dizziness.  She has first degree AV block with PR  interval of 240 milliseconds as well as intraventricular conduction  delay.  There is no current indication for a pacemaker insertion at this  time.  The patient was seen by physical therapist.  She ambulated quite  well.  She was independent with ambulation.  It was felt she would  require home health, PT, and OT.  This has been arranged.  Etiology of  the dizziness is still elusive.  However, the patient somewhat described  this as being swimming headedness.  She was started on a trial of  meclizine which she will continue for 5 more days and p.r.n. thereafter.  She is doing well from the dizziness standpoint and symptoms are much  better than on admission.   She had a 2D echocardiogram which showed normal left ventricular  systolic function with ejection fraction of 55%.  There was no  diagnostic evidence of left ventricular regional wall motion  abnormality.  Doppler parameters are consistent with high left  ventricular filling pressure.   She had carotid Dopplers to rule out carotid artery stenosis.  This  showed mild soft plaque bilaterally.  Vertebral artery flow was  antegrade.  No significant right to  left internal carotid artery  stenosis noted.   The patient developed acute back pain during the course of  hospitalization associated with lower lumbar spasm.  She reports that  her bed was uncomfortable for her.  She has mild paralumbar tenderness.  She was treated with pain relief and warm pack which seem to have  improved.  She will continue with PT and OT at home.   DISPOSITION:  The patient will be discharged home.  Home health PT and  OT will follow up with her.  Our home health RN will also follow up with  her to monitor effects of Flexeril.   DISCHARGE MEDICATIONS:  1. Flexeril 5 mg p.o. at bedtime for 1 week, then p.r.n.  2. Meclizine 12.5 mg b.i.d. for 5 days, then p.r.n.  3. Vicodin 1 tablet p.o. q.4-6 h. p.r.n. back pain.  4. Cozaar 50 mg p.o. daily.   DISCHARGE CONDITION:  Stable and improved.  She is having improvement in  dizziness and low back pain.  She will be discharged home with home  health  PT, OT, and RN.   DISCONTINUED MEDICATIONS:  1. Furosemide.  2. Cozaar.  3. Amlodipine.   These were discontinued in view of volume depletion and borderline blood  pressure at the time of admission.  Blood pressure has been stable.  At  the time of discharge, blood pressure was 133/72.  She will be  restarting Cozaar 50 mg daily.  Upon discharge, she should have BMET  done in 2 weeks.  Metformin has been discontinued secondary to lactic  acidosis.  Blood glucose is reasonably controlled.  She is currently on  increased dose of glyburide 10 mg daily.  Further adjustment per Dr. Morrie Sheldon  on outpatient basis.   RECOMMENDATIONS:  Check BMET in 2 weeks.      Mobolaji B. Corky Downs, M.D.  Electronically Signed     MBB/MEDQ  D:  12/12/2007  T:  12/13/2007  Job:  [id number]   cc:   Karene Fry Day, MD

## 2011-02-02 ENCOUNTER — Encounter: Payer: Self-pay | Admitting: *Deleted

## 2011-02-02 DIAGNOSIS — I10 Essential (primary) hypertension: Secondary | ICD-10-CM

## 2011-05-12 LAB — COMPREHENSIVE METABOLIC PANEL
AST: 24 U/L (ref 0–37)
Albumin: 4.2 g/dL (ref 3.5–5.2)
Alkaline Phosphatase: 131 U/L — ABNORMAL HIGH (ref 39–117)
BUN: 16 mg/dL (ref 6–23)
CO2: 28 mEq/L (ref 19–32)
Chloride: 101 mEq/L (ref 96–112)
Creatinine, Ser: 0.7 mg/dL (ref 0.4–1.2)
GFR calc Af Amer: >60 mL/min (ref >60)
GFR calc non Af Amer: >60 mL/min (ref >60)
Potassium: 3.8 mEq/L (ref 3.5–5.1)
Total Bilirubin: 0.8 mg/dL (ref 0.3–1.2)

## 2011-05-12 LAB — URINALYSIS, ROUTINE W REFLEX MICROSCOPIC
Hgb urine dipstick: NEGATIVE
Nitrite: NEGATIVE
Protein, ur: NEGATIVE mg/dL
Specific Gravity, Urine: 1.009 (ref 1.005–1.030)
Urobilinogen, UA: 1 mg/dL (ref 0.0–1.0)

## 2011-05-12 LAB — POCT CARDIAC MARKERS
CKMB, poc: 1.9 ng/mL (ref 1.0–8.0)
CKMB, poc: 2.8 ng/mL (ref 1.0–8.0)
Myoglobin, poc: 111 ng/mL (ref 12–200)
Troponin i, poc: <0.05 ng/mL (ref 0.00–0.09)

## 2011-05-12 LAB — CARDIAC PANEL(CRET KIN+CKTOT+MB+TROPI)
CK, MB: 1.4 ng/mL (ref 0.3–4.0)
CK, MB: 1.9 ng/mL (ref 0.3–4.0)
Relative Index: 1.8 (ref 0.0–2.5)
Total CK: 70 U/L (ref 7–177)
Troponin I: 0.01 ng/mL (ref 0.00–0.06)

## 2011-05-12 LAB — CBC
HCT: 37.5 % (ref 36.0–46.0)
MCV: 87.3 fL (ref 78.0–100.0)
Platelets: 209 10*3/uL (ref 150–400)
RBC: 4.3 MIL/uL (ref 3.87–5.11)
WBC: 4.5 10*3/uL (ref 4.0–10.5)

## 2011-05-12 LAB — GLUCOSE, CAPILLARY
Glucose-Capillary: 105 mg/dL — ABNORMAL HIGH (ref 70–99)
Glucose-Capillary: 107 mg/dL — ABNORMAL HIGH (ref 70–99)
Glucose-Capillary: 130 mg/dL — ABNORMAL HIGH (ref 70–99)
Glucose-Capillary: 135 mg/dL — ABNORMAL HIGH (ref 70–99)
Glucose-Capillary: 138 mg/dL — ABNORMAL HIGH (ref 70–99)
Glucose-Capillary: 141 mg/dL — ABNORMAL HIGH (ref 70–99)
Glucose-Capillary: 157 mg/dL — ABNORMAL HIGH (ref 70–99)
Glucose-Capillary: 158 mg/dL — ABNORMAL HIGH (ref 70–99)
Glucose-Capillary: 216 mg/dL — ABNORMAL HIGH (ref 70–99)

## 2011-05-12 LAB — DIFFERENTIAL
Basophils Absolute: 0 10*3/uL (ref 0.0–0.1)
Basophils Relative: 1 % (ref 0–1)
Eosinophils Absolute: 0 10*3/uL (ref 0.0–0.7)
Eosinophils Relative: 1 % (ref 0–5)
Lymphocytes Relative: 44 % (ref 12–46)
Monocytes Absolute: 0.4 10*3/uL (ref 0.1–1.0)

## 2011-05-12 LAB — LIPID PANEL
LDL Cholesterol: 68 mg/dL (ref 0–99)
Triglycerides: 34 mg/dL (ref <150)
VLDL: 7 mg/dL (ref 0–40)

## 2011-05-12 LAB — LIPASE, BLOOD: Lipase: 78 U/L — ABNORMAL HIGH (ref 11–59)

## 2011-10-02 ENCOUNTER — Ambulatory Visit (INDEPENDENT_AMBULATORY_CARE_PROVIDER_SITE_OTHER): Payer: Medicare Other | Admitting: Internal Medicine

## 2011-10-02 ENCOUNTER — Other Ambulatory Visit (INDEPENDENT_AMBULATORY_CARE_PROVIDER_SITE_OTHER): Payer: Medicare Other

## 2011-10-02 ENCOUNTER — Encounter: Payer: Self-pay | Admitting: Internal Medicine

## 2011-10-02 DIAGNOSIS — M1990 Unspecified osteoarthritis, unspecified site: Secondary | ICD-10-CM

## 2011-10-02 DIAGNOSIS — E119 Type 2 diabetes mellitus without complications: Secondary | ICD-10-CM

## 2011-10-02 DIAGNOSIS — G959 Disease of spinal cord, unspecified: Secondary | ICD-10-CM

## 2011-10-02 DIAGNOSIS — M545 Low back pain, unspecified: Secondary | ICD-10-CM

## 2011-10-02 DIAGNOSIS — R269 Unspecified abnormalities of gait and mobility: Secondary | ICD-10-CM

## 2011-10-02 LAB — HEMOGLOBIN A1C: Hgb A1c MFr Bld: 7 % — ABNORMAL HIGH (ref 4.6–6.5)

## 2011-10-02 MED ORDER — MELOXICAM 15 MG PO TABS: 15.0000 mg | ORAL_TABLET | Freq: Every day | ORAL | Status: AC

## 2011-10-02 NOTE — Patient Instructions (Signed)
It was good to see you today. We have reviewed your prior records including labs and tests today Start meloxicam daily for arthritis - we'll make referral to [facility] as discussed for therapy and consideration of power wheel chair. Our office will contact you regarding appointment(s) once made. Other Medications reviewed, no changes at this time. Please schedule followup in 6 weeks to continue review, call sooner if problems.

## 2011-10-02 NOTE — Assessment & Plan Note (Signed)
Severe cord compression early 2012 status post emergent decompression ACDF C3-4 to prevent paralysis Since that time, improved use of bilateral upper extremities but remains weak in bilateral lower extremities and wheelchair-bound for mobility needs Underwent CIR rehabilitation therapy following surgery then discharged to [facility] Briefly lived with friend [place] following discharge from SNF but back to different SNF since 12/2010 Patient request new power wheelchair to help with mobility -  will ask HH to help investigate and arrange for same if they're able to do so through SNF facility - I explained to patient this may not be feasible

## 2011-10-02 NOTE — Assessment & Plan Note (Signed)
Lab Results  Component Value Date   HGBA1C 7.0* 10/02/2011   The current medical regimen is effective;  continue present plan and medications.

## 2011-10-02 NOTE — Progress Notes (Signed)
Subjective:    Patient ID: Ashley Fritz, female    DOB: 25-Jan-1927, 78 y.o.   MRN: [id number]  HPI here for follow up - reviewed chronic medical issues:   severe cervical myelopathy, dx fall 2011 following accidenal fall in spring 2011> rapidly progressive balance problems and BLE>BUE weakness - s/p ACDF C3-4 09/2010 related to same with elsner - arm strength has improved but still unable to walk. Reports desire to resume physical therapy, has not engaged in therapy since May 2012  DM2 -reports unable to tolerate metformin due to nausea -  on glimeperide and actos  - denies signs or symptoms hypoglycemia- no PU/PD   HTN - reports compliance with ongoing medical treatment and no changes in medication dose or frequency. denies adverse side effects related to current therapy. No chest pain, headache, no dizziness or syncope   Dyslipidemia - reports compliance with ongoing medical treatment and denies rx'd changes in medication dose or frequency. denies adverse side effects related to current therapy.     arthritis, diffuse - involves R>L knees and low back pain (?spinal stenosis) - precipitated by accidental fall spring 2011 - previously followed with [facility] summer 2011 for same before dx cervical myelopathy fall 2011 - s/p OPPT and CIR and SNF for mobility - tried cymbalta, then diclofenac and ibuprofen for pain - still unable to walk ? new power w/c> pt has talked with [facility] who referred her to her PCP (?Me or Leanord Hawking @ snf - pt unsure)   Gait disorder - multifactorial related to cervical myelopathy dx, DDD and arthirits as above - w/c bound overall - affecting strength ue as well as legs and balance - no neck pain - has fallen x4, last 12/2010, ?renew [facility] PT/OT - felt she was doing better with therapy at [facility] but moved to [place] in 12/2010    Past Medical History  Diagnosis Date  . ANEMIA-NOS   . AORTIC STENOSIS/ INSUFFICIENCY, NON-RHEUMATIC   . CVA 07/2008    no residual  deficits  . DIABETES MELLITUS, TYPE II   . GAIT DISTURBANCE     multifactorial: cervical myelopathy, DDD lumbar spine and knee OA  . HYPERLIPIDEMIA   . HYPERTENSION   . LOW BACK PAIN   . MYELOPATHY, CERVICAL SPINE     s/p ACDF 10/2010 C3-4, residual BLE weakness, WC bound since 08/2010 but improved use BUE  . OSTEOARTHRITIS     R>L knee  . PRESSURE ULCER LOWER BACK 08/26/2010  . Cataracts, bilateral    Family History  Problem Relation Age of Onset  . Diabetes Mother   . Alcohol abuse Father    History  Substance Use Topics  . Smoking status: Never Smoker   . Smokeless tobacco: Not on file   Comment: since 09/2010 neck surgery, lived at [facility], then with friend, then [facility], [facility], [facility] SNF  . Alcohol Use: Not on file    Review of Systems Constitutional: Negative for fever or weight change.  Respiratory: Negative for cough and shortness of breath.   Cardiovascular: Negative for chest pain or palpitations.  Gastrointestinal: Negative for abdominal pain, no bowel changes.  Musculoskeletal: Negative for gait problem or joint swelling.  Skin: Negative for rash.  Neurological: Negative for dizziness or headache.  No other specific complaints in a complete review of systems (except as listed in HPI above).     Objective:   Physical Exam BP 122/60  Pulse 66  Temp(Src) 97 F (36.1 C) (Oral)  SpO2  96% Wt Readings from Last 3 Encounters:  07/26/10 164 lb (74.39 kg)  07/22/10 166 lb (75.297 kg)  06/10/10 166 lb (75.297 kg)   Constitutional: She appears well-developed and well-nourished. No distress. sitting in WC Eyes: Conjunctivae and EOM are normal. Pupils are equal, round, and reactive to light. No scleral icterus.  Neck: Normal range of motion. Neck supple. No JVD present. No thyromegaly present.  Cardiovascular: Normal rate, regular rhythm and normal heart sounds.  No murmur heard. No BLE edema. Pulmonary/Chest: Effort normal and breath sounds normal. No respiratory distress.  She has no wheezes.  Musculoskeletal: R+L knee - boggy synovitis - tender to palpation over joint line; FROM and ligamentous function intact - no joint effusions. No gross deformities Neurological: She is alert and oriented to person, place, and time. No cranial nerve deficit. No dysarthria or dysphasia. 4/5 bilateral upper extremity strength, 3/5 bilateral hip flexor and unable to evaluate remaining lower ext - unable to stand or walk without assistance  Psychiatric: She has a normal mood and affect. Her behavior is normal. Judgment and thought content normal.    Lab Results  Component Value Date   WBC 4.1 09/29/2010   HGB 10.4* 09/29/2010   HCT 32.7* 09/29/2010   PLT 217 09/29/2010   GLUCOSE 147* 09/29/2010   CHOL 182 04/20/2010   TRIG 50.0 04/20/2010   HDL 71.60 04/20/2010   LDLCALC 100* 04/20/2010   ALT 21 09/23/2010   AST 18 09/23/2010   NA 142 09/29/2010   K 4.1 09/29/2010   CL 102 09/29/2010   CREATININE 0.57 09/29/2010   BUN 9 09/29/2010   CO2 33* 09/29/2010   TSH 0.81 04/20/2010   HGBA1C 8.9* 07/22/2010   MICROALBUR 0.65 10/14/2008       Assessment & Plan:  See problem list. Medications and labs reviewed today.  Time spent with pt today 42 minutes, greater than 50% time spent counseling patient on diabetes, myelopathy and residual inability to walk indep (uses WC for all mobility), osteoarthritis R>L knee and L spine and medication review. Also review of available SNF records

## 2011-10-02 NOTE — Assessment & Plan Note (Signed)
Chronic R>L knee pain - mild swelling, not improved with decreased mobility - Add NSAIDs daily x 2 weeks, then prn - meloxicam ordered - Also on prn Norco and uses lidoderm prn per SNF MAR - I recommended continuation of same

## 2011-10-02 NOTE — Assessment & Plan Note (Signed)
Multifactorial related to residual myelopathy deficits affecting bilateral lower extremities strength, bilateral knee and lumbar spine arthritis We'll ask for physical therapy and occupational therapy to evaluate treat and assess her DME needs as appropriate

## 2011-10-30 ENCOUNTER — Telehealth: Payer: Self-pay | Admitting: *Deleted

## 2011-10-30 NOTE — Telephone Encounter (Signed)
Niece return call back she states aunt is at [facility] nursing home & facility is wanting to know is md still going to be pt primary md. As of right now she is and if facility need to fax orders gave md fax #... 10/30/11@4 :40pm/LMB

## 2011-10-30 NOTE — Telephone Encounter (Signed)
Left msg on vm requesting call back concerning aunt. She has been put in nursing home and they are requiring some information. Called Camile back no ansew LMOM RTC.... 10/30/11@4 :11pm/LMB

## 2011-11-13 ENCOUNTER — Ambulatory Visit (INDEPENDENT_AMBULATORY_CARE_PROVIDER_SITE_OTHER): Payer: Medicare Other | Admitting: Internal Medicine

## 2011-11-13 ENCOUNTER — Encounter: Payer: Self-pay | Admitting: Internal Medicine

## 2011-11-13 VITALS — BP 122/80 | HR 61 | Temp 97.6°F | Resp 18

## 2011-11-13 DIAGNOSIS — E1142 Type 2 diabetes mellitus with diabetic polyneuropathy: Secondary | ICD-10-CM

## 2011-11-13 DIAGNOSIS — E1149 Type 2 diabetes mellitus with other diabetic neurological complication: Secondary | ICD-10-CM

## 2011-11-13 DIAGNOSIS — G959 Disease of spinal cord, unspecified: Secondary | ICD-10-CM

## 2011-11-13 DIAGNOSIS — M1710 Unilateral primary osteoarthritis, unspecified knee: Secondary | ICD-10-CM

## 2011-11-13 NOTE — Patient Instructions (Signed)
It was good to see you today. We have reviewed your prior records including labs and tests today Start meloxicam daily for arthritis - increase gabapentin to 300mg  3x/day for neuropathy pain Continue o work with Dr Leanord Hawking as PC, I can be consulting doc as needed Other Medications reviewed, no changes at this time. Please schedule followup in 3-4 months to continue review, call sooner if problems.

## 2011-11-13 NOTE — Progress Notes (Signed)
Subjective:    Patient ID: KOURTNEE LAHEY, female    DOB: Mar 23, 1927, 78 y.o.   MRN: [id number]  HPI here for follow up - reviewed chronic medical issues:   severe cervical myelopathy, dx fall 2011 following accidenal fall in spring 2011> rapidly progressive balance problems and BLE>BUE weakness - s/p ACDF C3-4 09/2010 related to same with [facility]ner - arm strength has improved but still unable to walk. Reports desire to resume physical therapy, has not engaged in therapy since May 2012  DM2 -reports unable to tolerate metformin due to nausea -  on glimeperide and actos  - denies signs or symptoms hypoglycemia- no PU/PD - significant neuropathy = feet burning despite current gabapentin   HTN - reports compliance with ongoing medical treatment and no changes in medication dose or frequency. denies adverse side effects related to current therapy. No chest pain, headache, no dizziness or syncope   Dyslipidemia - reports compliance with ongoing medical treatment and denies rx'd changes in medication dose or frequency. denies adverse side effects related to current therapy.     arthritis, diffuse - involves R>L knees and low back pain (?spinal stenosis) - precipitated by accidental fall spring 2011 - previously followed with [facility] summer 2011 for same before dx cervical myelopathy fall 2011 - s/p OPPT and CIR and SNF for mobility - tried cymbalta, then diclofenac and ibuprofen for pain - still unable to walk - ? new power w/c> pt has talked with [facility] who referred her to her PCP Leanord Hawking @ snf, me as Research scientist (medical))   Gait disorder - multifactorial related to cervical myelopathy dx, DDD and arthirits as above - w/c bound overall - affecting strength ue as well as legs and balance - no neck pain - has fallen x4, last 12/2010;  felt she was doing better with therapy at [facility] but moved to [facility] 12/2010 -variable access to therapy per patient     Past Medical History  Diagnosis Date  .  ANEMIA-NOS   . AORTIC STENOSIS/ INSUFFICIENCY, NON-RHEUMATIC   . CVA 07/2008    no residual deficits  . DIABETES MELLITUS, TYPE II   . GAIT DISTURBANCE     multifactorial: cervical myelopathy, DDD lumbar spine and knee OA  . HYPERLIPIDEMIA   . HYPERTENSION   . LOW BACK PAIN   . MYELOPATHY, CERVICAL SPINE     s/p ACDF 10/2010 C3-4, residual BLE weakness, WC bound since 08/2010 but improved use BUE  . OSTEOARTHRITIS     R>L knee  . PRESSURE ULCER LOWER BACK 08/26/2010  . Cataracts, bilateral     Review of Systems  Constitutional: Negative for fever or weight change.  Respiratory: Negative for cough and shortness of breath.   Cardiovascular: Negative for chest pain or palpitations.      Objective:   Physical Exam  BP 122/80  Pulse 61  Temp(Src) 97.6 F (36.4 C) (Oral)  Resp 18  SpO2 96% Wt Readings from Last 3 Encounters:  07/26/10 164 lb (74.39 kg)  07/22/10 166 lb (75.297 kg)  06/10/10 166 lb (75.297 kg)   Constitutional: She appears well-developed and well-nourished. No distress. sitting in [facility] - friend at side Neck: Normal range of motion. Neck supple. No JVD present. No thyromegaly present.  Cardiovascular: Normal rate, regular rhythm and normal heart sounds.  No murmur heard. No BLE edema. Pulmonary/Chest: Effort normal and breath sounds normal. No respiratory distress. She has no wheezes.  Musculoskeletal: R+L knee - boggy synovitis - tender to  palpation over joint line; FROM and ligamentous function intact - no joint effusions. No gross deformities - right shoulder  - decreased range of motion on forward flexion, abduction, and internal rotation. Positive impingement signs. Decreased strength with stressing of rotator cuff. Pain with crossed arm adduction. referred pain into distal deltoid. Tender over a.c. joint and subacromial.  Neurological: She is alert and oriented to person, place, and time. No cranial nerve deficit. No dysarthria or dysphasia. 4/5 bilateral upper  extremity strength -better L than R UE coordination, 3/5 bilateral hip flexor and unable to evaluate remaining lower ext - unable to stand or walk without assistance  Psychiatric: She has a normal mood and affect. Her behavior is normal. Judgment and thought content normal.    Lab Results  Component Value Date   WBC 4.1 09/29/2010   HGB 10.4* 09/29/2010   HCT 32.7* 09/29/2010   PLT 217 09/29/2010   GLUCOSE 147* 09/29/2010   CHOL 182 04/20/2010   TRIG 50.0 04/20/2010   HDL 71.60 04/20/2010   LDLCALC 100* 04/20/2010   ALT 21 09/23/2010   AST 18 09/23/2010   NA 142 09/29/2010   K 4.1 09/29/2010   CL 102 09/29/2010   CREATININE 0.57 09/29/2010   BUN 9 09/29/2010   CO2 33* 09/29/2010   TSH 0.81 04/20/2010   HGBA1C 7.0* 10/02/2011   MICROALBUR 0.65 10/14/2008       Assessment & Plan:  See problem list. Medications and labs reviewed today.  Time spent with pt and friend today 30 minutes, greater than 50% time spent counseling patient on diabetes, myelopathy and residual inability to walk indep (uses WC for all mobility), osteoarthritis R>L knee and L spine. Also review of available SNF records including medications and answering concerns about designation of PCP status - recommended provider of her facility remain PCP and utilize my services as "consulting" when needed

## 2012-01-16 DIAGNOSIS — I6789 Other cerebrovascular disease: Secondary | ICD-10-CM

## 2012-01-16 HISTORY — DX: Other cerebrovascular disease: I67.89

## 2012-11-19 DIAGNOSIS — I1310 Hypertensive heart and chronic kidney disease without heart failure, with stage 1 through stage 4 chronic kidney disease, or unspecified chronic kidney disease: Secondary | ICD-10-CM

## 2012-11-19 DIAGNOSIS — N189 Chronic kidney disease, unspecified: Secondary | ICD-10-CM

## 2012-11-19 DIAGNOSIS — E1129 Type 2 diabetes mellitus with other diabetic kidney complication: Secondary | ICD-10-CM

## 2012-12-20 DIAGNOSIS — I2510 Atherosclerotic heart disease of native coronary artery without angina pectoris: Secondary | ICD-10-CM

## 2013-01-07 DIAGNOSIS — E669 Obesity, unspecified: Secondary | ICD-10-CM

## 2013-02-25 ENCOUNTER — Non-Acute Institutional Stay (SKILLED_NURSING_FACILITY): Payer: PRIVATE HEALTH INSURANCE | Admitting: Internal Medicine

## 2013-02-25 DIAGNOSIS — M1990 Unspecified osteoarthritis, unspecified site: Secondary | ICD-10-CM

## 2013-02-25 DIAGNOSIS — E1165 Type 2 diabetes mellitus with hyperglycemia: Secondary | ICD-10-CM

## 2013-02-25 DIAGNOSIS — M4802 Spinal stenosis, cervical region: Secondary | ICD-10-CM

## 2013-02-25 NOTE — Progress Notes (Signed)
Patient ID: Ashley Fritz, female   DOB: October 31, 1926, 79 y.o.   MRN: [id number] Facility [facility]. Chief complaints review of medical issues/routine [facility] monthly visit for June. History; patient has been in the  facility since April of 2012. Last him a hospital in February of 2012 and rehabilitation. She is known no cervical C3-C4 spondylosis with a herniated nucleus pulposus. She underwent surgery by Dr. Danielle Dess. Vision as a rolling wheelchair in the facility. She is a very dependent for transfers and personal care. The does not appear to be any interim issues.  Past medical history Active Ambulatory Problems    Diagnosis Date Noted  . DIABETES MELLITUS, TYPE II 02/06/2007  . HYPERLIPIDEMIA 02/06/2007  . HYPOALBUMINEMIA 04/20/2010  . ANEMIA-NOS 04/20/2010  . HYPERTENSION 02/06/2007  . AORTIC STENOSIS/ INSUFFICIENCY, NON-RHEUMATIC 06/30/2009  . CVA 06/10/2010  . OSTEOARTHRITIS 02/06/2007  . LOW BACK PAIN 04/20/2010  . GAIT DISTURBANCE 06/10/2010  . EDEMA 04/20/2010  . MYELOPATHY, CERVICAL SPINE 08/26/2010  . PRESSURE ULCER LOWER BACK 08/26/2010  . UTI 08/26/2010   Resolved Ambulatory Problems    Diagnosis Date Noted  . No Resolved Ambulatory Problems   Past Medical History  Diagnosis Date  . Cataracts, bilateral    Past Surgical History  Procedure Laterality Date  . Appendectomy    . Abdominal hysterectomy    . Oophorectomy    . Anterior cervical decomp/discectomy fusion  09/2010    C3-4 for cord compression and HNP with myelopathy   Echocardiogram; I have reviewed this from 2010. She had an estimated valve area of 0.67 cm.   Medication list is reviewed.  Advanced directives; she is a full code   Physical; weight is 210 pounds. This appears to be declining gradually. Respiratory clear entry bilaterally. Cardiac soft 2/6 systolic ejection murmur at the aortic area. This does not radiate. Musculoskeletal severe osteoarthritis, which is  generalized  Impression/plan #1 Maj. disability appears to be secondary to cervical spondylosis. She is status post surgery, also severe generalized osteoarthritis. #2 mild aortic stenosis, listed in 2010 over valve area at that time was 0.67 cm. In any case, she does not appear to be symptomatic now, probably no further investigations are indicated unless she develops symptoms or #3 type 2 diabetes on insulin. Last hemoglobin A1c was 8 #4 chronic renal insufficiency with hypertension

## 2013-04-02 ENCOUNTER — Non-Acute Institutional Stay (SKILLED_NURSING_FACILITY): Payer: PRIVATE HEALTH INSURANCE | Admitting: Internal Medicine

## 2013-04-02 DIAGNOSIS — M4802 Spinal stenosis, cervical region: Secondary | ICD-10-CM

## 2013-04-02 DIAGNOSIS — E1129 Type 2 diabetes mellitus with other diabetic kidney complication: Secondary | ICD-10-CM

## 2013-04-02 NOTE — Progress Notes (Signed)
Patient ID: Ashley Fritz, female   DOB: 1927-02-04, 79 y.o.   MRN: [id number]          Facility [facility]. Chief complaints review of medical issues/routine [facility] monthly visit for July History; patient has been in the  facility since April of 2012. Last him a hospital in February of 2012 and rehabilitation. She is known no cervical C3-C4 spondylosis with a herniated nucleus pulposus. She underwent surgery by Dr. Danielle Dess.she is able to propel herself in a  wheelchair in the facility. She is a very dependent for transfers and personal care. The does not appear to be any interim issues.  Past medical history Active Ambulatory Problems      Diagnosis  Date Noted   .  DIABETES MELLITUS, TYPE II  02/06/2007   .  HYPERLIPIDEMIA  02/06/2007   .  HYPOALBUMINEMIA  04/20/2010   .  ANEMIA-NOS  04/20/2010   .  HYPERTENSION  02/06/2007   .  AORTIC STENOSIS/ INSUFFICIENCY, NON-RHEUMATIC  06/30/2009   .  CVA  06/10/2010   .  OSTEOARTHRITIS  02/06/2007   .  LOW BACK PAIN  04/20/2010   .  GAIT DISTURBANCE  06/10/2010   .  EDEMA  04/20/2010   .  MYELOPATHY, CERVICAL SPINE  08/26/2010   .  PRESSURE ULCER LOWER BACK  08/26/2010   .  UTI  08/26/2010      Resolved Ambulatory Problems      Diagnosis  Date Noted   .  No Resolved Ambulatory Problems      Past Medical History   Diagnosis  Date   .  Cataracts, bilateral        Past Surgical History   Procedure  Laterality  Date   .  Appendectomy       .  Abdominal hysterectomy       .  Oophorectomy       .  Anterior cervical decomp/discectomy fusion    09/2010       C3-4 for cord compression and HNP with myelopathy    Echocardiogram; I have reviewed this from 2010. She had an estimated valve area of 0.67 cm.   Medication list is reviewed.  Advanced directives; she is a full code   Physical; weight is 205 pounds. This appears to be declining gradually. Respiratory clear entry bilaterally. Cardiac soft 2/6 systolic ejection murmur at  the aortic area. This does not radiate. Musculoskeletal severe osteoarthritis, which is generalized  Impression/plan #1 Maj. disability appears to be secondary to cervical spondylosis. She is status post surgery, also severe generalized osteoarthritis. #2 mild aortic stenosis, listed in 2010 over valve area at that time was 0.67 cm. In any case, she does not appear to be symptomatic now, probably no further investigations are indicated unless she develops symptoms or #3 type 2 diabetes on insulin. Last hemoglobin A1c was 8 #4 chronic renal insufficiency with hypertension; last BUN of 15 and creatinine 1.4 This is a stable

## 2013-04-19 ENCOUNTER — Non-Acute Institutional Stay (SKILLED_NURSING_FACILITY): Payer: PRIVATE HEALTH INSURANCE | Admitting: Internal Medicine

## 2013-04-19 DIAGNOSIS — E1129 Type 2 diabetes mellitus with other diabetic kidney complication: Secondary | ICD-10-CM

## 2013-04-19 DIAGNOSIS — E1165 Type 2 diabetes mellitus with hyperglycemia: Secondary | ICD-10-CM

## 2013-04-19 DIAGNOSIS — M4802 Spinal stenosis, cervical region: Secondary | ICD-10-CM

## 2013-04-19 NOTE — Progress Notes (Signed)
Patient ID: Ashley Fritz, female   DOB: 1926/10/04, 79 y.o.   MRN: [id number]          Facility [facility]. Chief complaints review of medical issues/routine [facility] monthly visit for August History; patient has been in the  facility since April of 2012. Last him a hospital in February of 2012 and rehabilitation. She is known no cervical C3-C4 spondylosis with a herniated nucleus pulposus. She underwent surgery by Dr. Danielle Dess.she is able to propel herself in a  wheelchair in the facility. She is a very dependent for transfers and personal care.   She has been following by outside podiatry for a paronychia A. an ulcer on the right fourth toe. She was placed on Keflex and then Augmentin. I do not see any cultures of this area. I am not quite certain how this area is being dressed the last order I see is from July 23 for Silvadene cream.  Past medical history Active Ambulatory Problems      Diagnosis  Date Noted   .  DIABETES MELLITUS, TYPE II  02/06/2007   .  HYPERLIPIDEMIA  02/06/2007   .  HYPOALBUMINEMIA  04/20/2010   .  ANEMIA-NOS  04/20/2010   .  HYPERTENSION  02/06/2007   .  AORTIC STENOSIS/ INSUFFICIENCY, NON-RHEUMATIC  06/30/2009   .  CVA  06/10/2010   .  OSTEOARTHRITIS  02/06/2007   .  LOW BACK PAIN  04/20/2010   .  GAIT DISTURBANCE  06/10/2010   .  EDEMA  04/20/2010   .  MYELOPATHY, CERVICAL SPINE  08/26/2010   .  PRESSURE ULCER LOWER BACK  08/26/2010   .  UTI  08/26/2010      Resolved Ambulatory Problems      Diagnosis  Date Noted   .  No Resolved Ambulatory Problems      Past Medical History   Diagnosis  Date   .  Cataracts, bilateral        Past Surgical History   Procedure  Laterality  Date   .  Appendectomy       .  Abdominal hysterectomy       .  Oophorectomy       .  Anterior cervical decomp/discectomy fusion    09/2010       C3-4 for cord compression and HNP with myelopathy    Echocardiogram; I have reviewed this from 2010. She had an estimated valve  area of 0.67 cm.   Medication list is reviewed.  Advanced directives; she is a full code   Physical; weight is 205 pounds. This appears to be declining gradually. Respiratory clear entry bilaterally. Cardiac soft 2/6 systolic ejection murmur at the aortic area. This does not radiate. Musculoskeletal severe osteoarthritis, which is generalized  Impression/plan #1 Maj. disability appears to be secondary to cervical spondylosis. She is status post surgery, also severe generalized osteoarthritis. #2 mild aortic stenosis, listed in 2010 over valve area at that time was 0.67 cm. In any case, she does not appear to be symptomatic now, probably no further investigations are indicated unless she develops symptoms or #3 type 2 diabetes on insulin. Last hemoglobin A1c was 8 #4 chronic renal insufficiency with hypertension; last BUN of 15 and creatinine 1.4 This is a stable #5 diabetic foot ulcer on the right fourth toe. I will check in with the wound care team this week on the status of this

## 2013-05-23 ENCOUNTER — Ambulatory Visit (INDEPENDENT_AMBULATORY_CARE_PROVIDER_SITE_OTHER): Payer: PRIVATE HEALTH INSURANCE

## 2013-05-23 VITALS — BP 169/71 | HR 61 | Resp 16 | Ht 62.0 in | Wt 180.0 lb

## 2013-05-23 DIAGNOSIS — M79609 Pain in unspecified limb: Secondary | ICD-10-CM

## 2013-05-23 DIAGNOSIS — M1990 Unspecified osteoarthritis, unspecified site: Secondary | ICD-10-CM

## 2013-05-23 DIAGNOSIS — E1142 Type 2 diabetes mellitus with diabetic polyneuropathy: Secondary | ICD-10-CM

## 2013-05-23 DIAGNOSIS — E1149 Type 2 diabetes mellitus with other diabetic neurological complication: Secondary | ICD-10-CM

## 2013-05-23 DIAGNOSIS — B351 Tinea unguium: Secondary | ICD-10-CM

## 2013-05-23 DIAGNOSIS — E1140 Type 2 diabetes mellitus with diabetic neuropathy, unspecified: Secondary | ICD-10-CM

## 2013-05-23 NOTE — Progress Notes (Signed)
  Subjective:    Patient ID: Ashley Fritz, female    DOB: 1926-12-05, 79 y.o.   MRN: [id number] "Trim my toenails and check my right heel."  HPI patient presents at this time for followup and debridement of mycotic nails and presence of diabetes complicated factors patient also underwent care for an infected fourth toe nail fold which is at this time resolve no discharge or drainage is noted the ulcer the fourth toe right his result.    Review of Systems not reviewed to     Objective:   Physical Exam Ask your status is unchanged patient does have pulses DP +2 /4 bilateral PT 0/4 bilateral with significant +1 to +2 edema bilateral. There is no rubor noted mild varicosities noted bilateral. Temperature is warm to cool. Neurologically epicritic sensations revealed diminished sensation Semmes Weinstein testing to the toe although. Patient does have hyperesthesia paresthesia on palpation and attempted debridement of nails patient is in wheelchair nonambulatory, ulcers new complaint of pain in the posterior Achilles area right may be a pressure area due to the progressive wheelchair. Suggested padding and a pillow to cushion the posterior heels both in bed and in a wheelchair, hair growth absent bilateral. Nails hypertrophic friable discolored brittle and tender on palpation 1 through 5 bilateral.    Assessment & Plan:  Diabetes with neuropathy. There is result ulceration fourth toe right. There is pressure irritations the posterior heel and Achilles right. Suggested pillow or padding to that area especially at resting in wheelchair. Thick brittle painful dystrophic nails 1 through 5 bilateral are debrided this time the presence of diabetes cocking factors. Onychomycosis. Followup in 3 months for continued palliative care in the future as needed  Alvan Dame DPM

## 2013-05-23 NOTE — Patient Instructions (Signed)

## 2013-05-24 ENCOUNTER — Non-Acute Institutional Stay (SKILLED_NURSING_FACILITY): Payer: PRIVATE HEALTH INSURANCE | Admitting: Internal Medicine

## 2013-05-24 DIAGNOSIS — M4802 Spinal stenosis, cervical region: Secondary | ICD-10-CM

## 2013-05-24 DIAGNOSIS — M1990 Unspecified osteoarthritis, unspecified site: Secondary | ICD-10-CM

## 2013-05-24 NOTE — Progress Notes (Signed)
Patient ID: Ashley Fritz, female   DOB: October 03, 1926, 79 y.o.   MRN: [id number]           Facility [facility]. Chief complaints review of medical issues/routine [facility] monthly visit for September History; patient has been in the  facility since April of 2012. Last him a hospital in February of 2012 and rehabilitation. She is known no cervical C3-C4 spondylosis with a herniated nucleus pulposus. She underwent surgery by Dr. Danielle Dess.she is able to propel herself in a  wheelchair in the facility. She is a very dependent for transfers and personal care.   She has been following by outside podiatry for a paronychia A. an ulcer on the right fourth toe. He has been on antibiotics.  Probable poorly controlled diabetes currently on Lantus 20  She is complaining of a loose cough and also significant pain in both shoulders right greater than left   Past medical history Active Ambulatory Problems      Diagnosis  Date Noted   .  DIABETES MELLITUS, TYPE II  02/06/2007   .  HYPERLIPIDEMIA  02/06/2007   .  HYPOALBUMINEMIA  04/20/2010   .  ANEMIA-NOS  04/20/2010   .  HYPERTENSION  02/06/2007   .  AORTIC STENOSIS/ INSUFFICIENCY, NON-RHEUMATIC  06/30/2009   .  CVA  06/10/2010   .  OSTEOARTHRITIS  02/06/2007   .  LOW BACK PAIN  04/20/2010   .  GAIT DISTURBANCE  06/10/2010   .  EDEMA  04/20/2010   .  MYELOPATHY, CERVICAL SPINE  08/26/2010   .  PRESSURE ULCER LOWER BACK  08/26/2010   .  UTI  08/26/2010      Resolved Ambulatory Problems      Diagnosis  Date Noted   .  No Resolved Ambulatory Problems      Past Medical History   Diagnosis  Date   .  Cataracts, bilateral        Past Surgical History   Procedure  Laterality  Date   .  Appendectomy       .  Abdominal hysterectomy       .  Oophorectomy       .  Anterior cervical decomp/discectomy fusion    09/2010       C3-4 for cord compression and HNP with myelopathy    Echocardiogram; I have reviewed this from 2010. She had an estimated  valve area of 0.67 cm.   Medication list is reviewed.  Advanced directives; she is a full code   Physical; weight is 205 pounds. This appears to be declining gradually. Respiratory clear entry bilaterally. Cardiac soft 2/6 systolic ejection murmur at the aortic area. This does not radiate. Musculoskeletal severe osteoarthritis, which is generalized She appears to have severe degenerative changes in both shoulders. Tender over the anterior shoulder compatible with a subacromial bursitis. Very limited range  Impression/plan #1 Maj. disability appears to be secondary to cervical spondylosis. She is status post surgery, also severe generalized osteoarthritis. #2 mild aortic stenosis, listed in 2010 over valve area at that time was 0.67 cm. In any case, she does not appear to be symptomatic now, probably no further investigations are indicated unless she develops symptoms or #3 type 2 diabetes on insulin. Last hemoglobin A1c was 8 #4 chronic renal insufficiency with hypertension; last BUN of 15 and creatinine 1.4 This is a stable #5 diabetic foot ulcer on the right fourth toe. I will check in with the wound care team this  week on the status of this #6 severe generalized osteoarthritis especially problematic in both shoulders #7 loose cough I think when necessary cough medicine but suffice for the. She has poor air entry bilaterally but no wheezing her work of breathing is normal.

## 2013-06-28 ENCOUNTER — Non-Acute Institutional Stay (SKILLED_NURSING_FACILITY): Payer: PRIVATE HEALTH INSURANCE | Admitting: Internal Medicine

## 2013-06-28 DIAGNOSIS — E1129 Type 2 diabetes mellitus with other diabetic kidney complication: Secondary | ICD-10-CM

## 2013-06-28 DIAGNOSIS — M4802 Spinal stenosis, cervical region: Secondary | ICD-10-CM

## 2013-06-28 NOTE — Progress Notes (Signed)
Patient ID: Ashley Fritz, female   DOB: 1927-01-08, 79 y.o.   MRN: [id number]           Facility [facility]. Chief complaints review of medical issues/routine [facility] monthly visit for October History; patient has been in the  facility since April of 2012. Last in a hospital in February of 2012 and rehabilitation. She is known no cervical C3-C4 spondylosis with a herniated nucleus pulposus. She underwent surgery by Dr. Danielle Dess.she is able to propel herself in a  wheelchair in the facility. She is a very dependent for transfers and personal care.   She has been following by outside podiatry for a paronychia . an ulcer on the right fourth toe. He has been on antibiotics.  Probable poorly controlled diabetes currently on Lantus 20  She is complaining of a loose cough and also significant pain in both shoulders right greater than left   Past medical history Active Ambulatory Problems      Diagnosis  Date Noted   .  DIABETES MELLITUS, TYPE II  02/06/2007   .  HYPERLIPIDEMIA  02/06/2007   .  HYPOALBUMINEMIA  04/20/2010   .  ANEMIA-NOS  04/20/2010   .  HYPERTENSION  02/06/2007   .  AORTIC STENOSIS/ INSUFFICIENCY, NON-RHEUMATIC  06/30/2009   .  CVA  06/10/2010   .  OSTEOARTHRITIS  02/06/2007   .  LOW BACK PAIN  04/20/2010   .  GAIT DISTURBANCE  06/10/2010   .  EDEMA  04/20/2010   .  MYELOPATHY, CERVICAL SPINE  08/26/2010   .  PRESSURE ULCER LOWER BACK  08/26/2010   .  UTI  08/26/2010      Resolved Ambulatory Problems      Diagnosis  Date Noted   .  No Resolved Ambulatory Problems      Past Medical History   Diagnosis  Date   .  Cataracts, bilateral        Past Surgical History   Procedure  Laterality  Date   .  Appendectomy       .  Abdominal hysterectomy       .  Oophorectomy       .  Anterior cervical decomp/discectomy fusion    09/2010       C3-4 for cord compression and HNP with myelopathy    Echocardiogram; I have reviewed this from 2010. She had an estimated valve  area of 0.67 cm.   Medication list is reviewed.  Advanced directives; she is a full code   Physical; weight is 206 pounds. Stable from last month. Respiratory clear entry bilaterally. Cardiac soft 2/6 systolic ejection murmur at the aortic area. This does not radiate. Musculoskeletal severe osteoarthritis, which is generalized She appears to have severe degenerative changes in both shoulders. Tender over the anterior shoulder compatible with a subacromial bursitis. Very limited range  Impression/plan #1 Maj. disability appears to be secondary to cervical spondylosis. She is status post surgery, also severe generalized osteoarthritis. #2 mild aortic stenosis, listed in 2010 over valve area at that time was 0.67 cm. In any case, she does not appear to be symptomatic now, probably no further investigations are indicated unless she develops symptoms or #3 type 2 diabetes on insulin. Last hemoglobin A1c was 9.1 on October 16. Her Amaryl was discontinued and Lantus increased to 26 units she is now on NovoLog 4 units with all meals #4 chronic renal insufficiency with hypertension; last BUN of 15 and creatinine 1.4 This is  a stable #5 diabetic foot ulcer on the right fourth toe. I will check in with the wound care team this week on the status of this #6 severe generalized osteoarthritis especially problematic in both shoulders

## 2013-07-19 ENCOUNTER — Non-Acute Institutional Stay (SKILLED_NURSING_FACILITY): Payer: PRIVATE HEALTH INSURANCE | Admitting: Internal Medicine

## 2013-07-19 DIAGNOSIS — M4802 Spinal stenosis, cervical region: Secondary | ICD-10-CM

## 2013-07-19 DIAGNOSIS — I350 Nonrheumatic aortic (valve) stenosis: Secondary | ICD-10-CM

## 2013-07-19 DIAGNOSIS — I359 Nonrheumatic aortic valve disorder, unspecified: Secondary | ICD-10-CM

## 2013-07-19 DIAGNOSIS — E1129 Type 2 diabetes mellitus with other diabetic kidney complication: Secondary | ICD-10-CM

## 2013-07-19 DIAGNOSIS — L89609 Pressure ulcer of unspecified heel, unspecified stage: Secondary | ICD-10-CM

## 2013-07-19 NOTE — Progress Notes (Signed)
Patient ID: Ashley Fritz, female   DOB: Sep 03, 1926, 79 y.o.   MRN: [id number]           Facility [facility]. Chief complaints review of medical issues/routine [facility] monthly visit for November. History; patient has been in the  facility since April of 2012. Last in a hospital in February of 2012 and rehabilitation. She is known no cervical C3-C4 spondylosis with a herniated nucleus pulposus. She underwent surgery by Dr. Danielle Dess.she is able to propel herself in a  wheelchair in the facility. She is a very dependent for transfers and personal care.   She has been following by outside podiatry for a paronychia . an ulcer on the right fourth toe. He has been on antibiotics. Today she complains about an ulcer on her right heel,  look into the this with the wound care team  Probable poorly controlled diabetes currently on Lantus 26. Hemoglobin A1c of 9.1 on October 16, and this increased to 24 units.  Today she is complaining about bilateral heel pain, significant gastroesophageal reflux in the morning. With regards to her cardiac status she is not complaining of chest pain shortness of breath or palpitations. Weight at 206 pounds is stable to decreased over the last several month Past medical history Active Ambulatory Problems      Diagnosis  Date Noted   .  DIABETES MELLITUS, TYPE II  02/06/2007   .  HYPERLIPIDEMIA  02/06/2007   .  HYPOALBUMINEMIA  04/20/2010   .  ANEMIA-NOS  04/20/2010   .  HYPERTENSION  02/06/2007   .  AORTIC STENOSIS/ INSUFFICIENCY, NON-RHEUMATIC  06/30/2009   .  CVA  06/10/2010   .  OSTEOARTHRITIS  02/06/2007   .  LOW BACK PAIN  04/20/2010   .  GAIT DISTURBANCE  06/10/2010   .  EDEMA  04/20/2010   .  MYELOPATHY, CERVICAL SPINE  08/26/2010   .  PRESSURE ULCER LOWER BACK  08/26/2010   .  UTI  08/26/2010      Resolved Ambulatory Problems      Diagnosis  Date Noted   .  No Resolved Ambulatory Problems      Past Medical History   Diagnosis  Date   .   Cataracts, bilateral        Past Surgical History   Procedure  Laterality  Date   .  Appendectomy       .  Abdominal hysterectomy       .  Oophorectomy       .  Anterior cervical decomp/discectomy fusion    09/2010       C3-4 for cord compression and HNP with myelopathy    Echocardiogram; I have reviewed this from 2010. She had an estimated valve area of 0.67 cm.   Medication list is reviewed.  Advanced directives; she is a full code   Physical; weight is 206 pounds. Stable from last month. Respiratory clear entry bilaterally. Cardiac soft 2/6 systolic ejection murmur at the aortic area. This does not radiate. Musculoskeletal severe osteoarthritis, which is generalized She appears to have severe degenerative changes in both shoulders. Tender over the anterior shoulder compatible with a subacromial bursitis. Very limited range. She has tenderness over her left greater than right  Impression/plan #1 Maj. disability appears to be secondary to cervical spondylosis. She is status post surgery, also severe generalized osteoarthritis. #2  aortic stenosis, listed in 2010 over valve area at that time was 0.67 cm. In any case, she  does not appear to be symptomatic now, probably no further investigations are indicated unless she develops symptoms  #3 type 2 diabetes on insulin. Last hemoglobin A1c was 9.1 on October 16. Her Amaryl was discontinued and Lantus increased to 26 units she is now on NovoLog 4 units with all meals #4 chronic renal insufficiency with hypertension; last BUN of 15 and creatinine 1.4 This is a stable #5 diabetic foot ulcer on the right fourth toe. I will check in with the wound care team this week on the status of this. I am concerned about both her heels as well as the wound care team to help me review these this week #6 severe generalized osteoarthritis especially problematic in both shoulders

## 2013-08-05 ENCOUNTER — Ambulatory Visit (INDEPENDENT_AMBULATORY_CARE_PROVIDER_SITE_OTHER): Payer: PRIVATE HEALTH INSURANCE

## 2013-08-05 VITALS — BP 141/73 | HR 61 | Resp 16 | Ht 62.0 in | Wt 197.0 lb

## 2013-08-05 DIAGNOSIS — M79609 Pain in unspecified limb: Secondary | ICD-10-CM

## 2013-08-05 DIAGNOSIS — E1149 Type 2 diabetes mellitus with other diabetic neurological complication: Secondary | ICD-10-CM

## 2013-08-05 DIAGNOSIS — L97401 Non-pressure chronic ulcer of unspecified heel and midfoot limited to breakdown of skin: Secondary | ICD-10-CM

## 2013-08-05 DIAGNOSIS — E1142 Type 2 diabetes mellitus with diabetic polyneuropathy: Secondary | ICD-10-CM

## 2013-08-05 DIAGNOSIS — E1140 Type 2 diabetes mellitus with diabetic neuropathy, unspecified: Secondary | ICD-10-CM

## 2013-08-05 DIAGNOSIS — L97409 Non-pressure chronic ulcer of unspecified heel and midfoot with unspecified severity: Secondary | ICD-10-CM

## 2013-08-05 DIAGNOSIS — M1990 Unspecified osteoarthritis, unspecified site: Secondary | ICD-10-CM

## 2013-08-05 MED ORDER — COLLAGENASE 250 UNIT/GM EX OINT: 1.0000 "application " | TOPICAL_OINTMENT | Freq: Every day | CUTANEOUS | Status: DC

## 2013-08-05 NOTE — Progress Notes (Signed)
   Subjective:    Patient ID: Ashley Fritz, female    DOB: 1926/11/18, 79 y.o.   MRN: [id number]  HPI   "I want the doctor to look at that hole in my heel.  He said he would trim my toenails."   Review of Systems no new changes or findings     Objective:   Physical Exam Vascular status is intact pedal pulses DP +2/4 PT nonpalpable bilateral patient nonambulatory and wheelchair. There is ulceration posterior aspects of both heel small superficial ulcer posterior left heel over a larger ulcer are about 3 cm overall diameter posterior right heel with some macerated and hyperkeratoses and eschar tissue being noted. Patient been having dressing changes every 5 days apparently according to the nurse was present with daughter. There is no malodor no ascending cellulitis lymphangitis patient describes pain in the posterior heel areas is scheduled for nail care with the next 2 or 3 weeks are presents this time to have her heel ulcers evaluated. The ulcer the right seems to is exacerbated with mild serous drainage discharge or drainage. I'm not sure what treatment is being applied to the ulcer at this point records from the nursing facility do not indicate wet dressings or medications are being applied. Patient has profound neuropathy as well as angiopathy.       Assessment & Plan:  Ulceration bilateral heels patient also is mycotic friable discolored nails will be dressed with the next Mateen Franssen weeks during followup visit this time the ulcers are cleansed with all cleansed at this time Silvadene and gauze dressing applied both ulcer sites a larger ulcer right heel smaller ulcer left heel. At this time dispensed instructions and prescription for did for Santyl ointment to be applied for enzymatic debridement of the ulcers both posterior areas bilateral reevaluate in 2 weeks for followup indicated for alternative treatments for the posterior ulcer however she is currently wearing heel protectors cushioning  in doing dressing changes we'll reassess in 2 weeks for followup nursing is given instructions for Santyl application to  [employer] DPM

## 2013-08-05 NOTE — Patient Instructions (Signed)
Instructions for Wound Care as ordered on 08/05/2013 To be done by nursing daily  The most important step to healing a foot wound is to reduce the pressure on your foot - it is extremely important to stay off your foot as much as possible and wear the shoe/boot as instructed.  Cleanse your foot with saline wash or warm soapy water (dial antibacterial soap or similar).  Blot dry.  Apply prescribed medication to your wound and cover with gauze and a bandage.  May hold bandage in place with Coban (self sticky wrap), Ace bandage or tape.  You may find dressing supplies at your local [employer], [employer], drug store or medical supply store.  Your prescribed topical medication is :  Santyl ointment. 3 applied to the affected ulcer sites once daily after cleansing with soap and water. Apply Santyl and a dry sterile dressing daily bilateral heel ulcer site.  [employer] is a mail order medical supply company that we use to provide some of our would care products.  If we use their service of you, you will receive the product by mail.  If you have not received the medication in 3 business days, please call our office.  If you notice any foul odor, increase in pain, pus, increased swelling, red streaks or generalized redness occurring in your foot or leg-Call our office immediately to be seen.  This may be a sign of a limb or life threatening infection that will need prompt attention.  Alvan Dame, [facility]  [facility]  [phone] [place]  [phone] [facility]

## 2013-08-06 ENCOUNTER — Telehealth: Payer: Self-pay | Admitting: *Deleted

## 2013-08-06 NOTE — Telephone Encounter (Addendum)
Ronney Asters and Arnette Norris called, stating pt was prescribed Santyl ointment, but does not have opened areas on either heels, one only has a pink scar, the other has no area of irritation at all.  The caregivers request a clarification of the orders.  Dr Ralene Cork states when in the office yesterday the pt's dressings were damp, and the pt is to have Santyl dressing as directed.  I called the orders to [facility] [phone] to [facility].

## 2013-08-22 ENCOUNTER — Ambulatory Visit (INDEPENDENT_AMBULATORY_CARE_PROVIDER_SITE_OTHER): Payer: PRIVATE HEALTH INSURANCE

## 2013-08-22 VITALS — BP 160/86 | HR 65 | Resp 18 | Ht 62.0 in | Wt 179.0 lb

## 2013-08-22 DIAGNOSIS — E1140 Type 2 diabetes mellitus with diabetic neuropathy, unspecified: Secondary | ICD-10-CM

## 2013-08-22 DIAGNOSIS — M79609 Pain in unspecified limb: Secondary | ICD-10-CM

## 2013-08-22 DIAGNOSIS — E1142 Type 2 diabetes mellitus with diabetic polyneuropathy: Secondary | ICD-10-CM

## 2013-08-22 DIAGNOSIS — E1141 Type 2 diabetes mellitus with diabetic mononeuropathy: Secondary | ICD-10-CM

## 2013-08-22 DIAGNOSIS — E1149 Type 2 diabetes mellitus with other diabetic neurological complication: Secondary | ICD-10-CM

## 2013-08-22 DIAGNOSIS — M1990 Unspecified osteoarthritis, unspecified site: Secondary | ICD-10-CM

## 2013-08-22 NOTE — Progress Notes (Signed)
   Subjective:    Patient ID: Ashley Fritz, female    DOB: 01/25/1927, 80 y.o.   MRN: [id number] Pt present for follow up of the ulcer of the heels and states the care facillity is not putting on the medication.  Pt states she bought a lotion to use on the heels, but did not bring to this visit. HPI    Review of Systems no you changes or findings     Objective:   Physical Exam Vascular status unchanged pedal pulses palpable DP pulse two over four PT nonpalpable bilateral absent hair growth diminished skin texture temperature patient multiple pigmented lesions both lower extremities consistent with diabetic neuropathy and angiopathy. The heels are examined this time the left heel is completely resolved no open wounds ulcerations noted right heel has some abnormal pigmentary changes with resolved eschar tissue and ulceration posterior right heel. No active ulcer noted at this time no discharge or drainage noted for patient's complaints of paresthesia burning pain in the posterior heel area. Patient is advised this is diabetic neuropathy and angiopathy as well as residual scar tissue patient is currently on medications for her neuropathy and it may not resolve that symptom completely. May recommendations for lotion application daily and maintaining heel cushions as already in place.       Assessment & Plan:  Assessment resolved ulceration posterior heel however patient continues to have residual neuropathy and neuritis associated with diabetic neuropathy as well as scar tissue neuralgia. Will monitor for any recurrence of ulceration. Maintain lotion to the affected area daily and a cushion heel pad. Reappointed one to 2 months for palliative nail care if needed  Alvan Dameichard Riyan Haile DPM

## 2013-08-22 NOTE — Patient Instructions (Signed)
Diabetes and Foot Care Diabetes may cause you to have problems because of poor blood supply (circulation) to your feet and legs. This may cause the skin on your feet to become thinner, break easier, and heal more slowly. Your skin may become dry, and the skin may peel and crack. You may also have nerve damage in your legs and feet causing decreased feeling in them. You may not notice minor injuries to your feet that could lead to infections or more serious problems. Taking care of your feet is one of the most important things you can do for yourself.  HOME CARE INSTRUCTIONS  Wear shoes at all times, even in the house. Do not go barefoot. Bare feet are easily injured.  Check your feet daily for blisters, cuts, and redness. If you cannot see the bottom of your feet, use a mirror or ask someone for help.  Wash your feet with warm water (do not use hot water) and mild soap. Then pat your feet and the areas between your toes until they are completely dry. Do not soak your feet as this can dry your skin.  Apply a moisturizing lotion or petroleum jelly (that does not contain alcohol and is unscented) to the skin on your feet and to dry, brittle toenails. Do not apply lotion between your toes.  Trim your toenails straight across. Do not dig under them or around the cuticle. File the edges of your nails with an emery board or nail file.  Do not cut corns or calluses or try to remove them with medicine.  Wear clean socks or stockings every day. Make sure they are not too tight. Do not wear knee-high stockings since they may decrease blood flow to your legs.  Wear shoes that fit properly and have enough cushioning. To break in new shoes, wear them for just a few hours a day. This prevents you from injuring your feet. Always look in your shoes before you put them on to be sure there are no objects inside.  Do not cross your legs. This may decrease the blood flow to your feet.  If you find a minor scrape,  cut, or break in the skin on your feet, keep it and the skin around it clean and dry. These areas may be cleansed with mild soap and water. Do not cleanse the area with peroxide, alcohol, or iodine.  When you remove an adhesive bandage, be sure not to damage the skin around it.  If you have a wound, look at it several times a day to make sure it is healing.  Do not use heating pads or hot water bottles. They may burn your skin. If you have lost feeling in your feet or legs, you may not know it is happening until it is too late.  Make sure your health care provider performs a complete foot exam at least annually or more often if you have foot problems. Report any cuts, sores, or bruises to your health care provider immediately. SEEK MEDICAL CARE IF:   You have an injury that is not healing.  You have cuts or breaks in the skin.  You have an ingrown nail.  You notice redness on your legs or feet.  You feel burning or tingling in your legs or feet.  You have pain or cramps in your legs and feet.  Your legs or feet are numb.  Your feet always feel cold. SEEK IMMEDIATE MEDICAL CARE IF:   There is increasing redness,   swelling, or pain in or around a wound.  There is a red line that goes up your leg.  Pus is coming from a wound.  You develop a fever or as directed by your health care provider.  You notice a bad smell coming from an ulcer or wound. Document Released: 07/21/2000 Document Revised: 03/26/2013 Document Reviewed: 12/31/2012 [facility] Patient Information 2014 [place].    Recommend application of a moisturizing lotion or cream to the feet and ankles every day. Maintain cushion on posterior heel area both heels. Monitor for any recurrence of ulceration.

## 2013-09-23 ENCOUNTER — Ambulatory Visit: Payer: PRIVATE HEALTH INSURANCE

## 2013-09-23 ENCOUNTER — Encounter: Payer: Self-pay | Admitting: *Deleted

## 2013-10-10 ENCOUNTER — Ambulatory Visit: Payer: PRIVATE HEALTH INSURANCE

## 2013-10-21 ENCOUNTER — Ambulatory Visit: Payer: PRIVATE HEALTH INSURANCE

## 2013-11-23 ENCOUNTER — Non-Acute Institutional Stay (SKILLED_NURSING_FACILITY): Payer: PRIVATE HEALTH INSURANCE | Admitting: Internal Medicine

## 2013-11-23 DIAGNOSIS — M4802 Spinal stenosis, cervical region: Secondary | ICD-10-CM

## 2013-11-23 DIAGNOSIS — M1990 Unspecified osteoarthritis, unspecified site: Secondary | ICD-10-CM

## 2013-11-23 DIAGNOSIS — I359 Nonrheumatic aortic valve disorder, unspecified: Secondary | ICD-10-CM

## 2013-11-23 DIAGNOSIS — I350 Nonrheumatic aortic (valve) stenosis: Secondary | ICD-10-CM

## 2013-11-23 DIAGNOSIS — E1165 Type 2 diabetes mellitus with hyperglycemia: Secondary | ICD-10-CM

## 2013-11-23 DIAGNOSIS — E1129 Type 2 diabetes mellitus with other diabetic kidney complication: Secondary | ICD-10-CM

## 2013-11-23 NOTE — Progress Notes (Signed)
Patient ID: Ashley Fritz, female   DOB: 09/04/1926, 80 y.o.   MRN: [id number]           Facility [facility]. Chief complaints review of medical issues/routine [facility] monthly visit for March . History; patient has been in the  facility since April of 2012. Last in a hospital in February of 2012 and rehabilitation. She is known no cervical C3-C4 spondylosis with a herniated nucleus pulposus. She underwent surgery by Dr. Danielle DessElsner.she is able to propel herself in a  wheelchair in the facility. She is a very dependent for transfers and personal care.   Most of her complaints today have to do with the fact that she is not changed and so. She states she knows when she has to void although she cannot get changed fast enough or frequently enough. She is a Physiological scientistHoyer lift transfer. Propels herself around the facility in her motorized wheelchair   Active Ambulatory Problems      Diagnosis  Date Noted   .  DIABETES MELLITUS, TYPE II  02/06/2007   .  HYPERLIPIDEMIA  02/06/2007   .  HYPOALBUMINEMIA  04/20/2010   .  ANEMIA-NOS  04/20/2010   .  HYPERTENSION  02/06/2007   .  AORTIC STENOSIS/ INSUFFICIENCY, NON-RHEUMATIC  06/30/2009   .  CVA  06/10/2010   .  OSTEOARTHRITIS  02/06/2007   .  LOW BACK PAIN  04/20/2010   .  GAIT DISTURBANCE  06/10/2010   .  EDEMA  04/20/2010   .  MYELOPATHY, CERVICAL SPINE  08/26/2010   .  PRESSURE ULCER LOWER BACK  08/26/2010   .  UTI  08/26/2010      Resolved Ambulatory Problems      Diagnosis  Date Noted   .  No Resolved Ambulatory Problems      Past Medical History   Diagnosis  Date   .  Cataracts, bilateral        Past Surgical History   Procedure  Laterality  Date   .  Appendectomy       .  Abdominal hysterectomy       .  Oophorectomy       .  Anterior cervical decomp/discectomy fusion    09/2010       C3-4 for cord compression and HNP with myelopathy    Echocardiogram; I have reviewed this from 2011. She had an estimated valve area of 0.67 cm.    Medication list is reviewed. Lantus insulin 26 units every night Nitroglycerin sublingual when necessary NovoLog 5 units with a.c. meal Norvasc 5 mg every morning Enteric-coated aspirin 81 daily Lasix 20 daily MiraLax 17 g daily Prilosec 40 mg daily Lidocaine 5% patch to right anterior shoulder and both knees Lessard and 100 mg daily Tylenol 325 2 tablets twice daily Neurontin 300 mg 3 times a day Lipitor 20 daily Vitamin D3 50,000 units monthly  Advanced directives; she is a full code   Physical;  Gen.; patient is up in her wheelchair conversational Vitals temperature is 97.2-pulse 78-respirations 20-blood pressure 142/68-weight 215 pounds-O2 sat 92% on room air Respiratory clear entry bilaterally. Cardiac soft 2/6 systolic ejection murmur at the aortic area. This does not radiate. Musculoskeletal severe osteoarthritis, which is generalized She appears to have severe degenerative changes in both shoulders. Tender over the anterior shoulder compatible with a subacromial bursitis. Very limited range. She has tenderness over her left greater than right  Impression/plan #1 Maj. disability appears to be secondary to cervical spondylosis.  She is status post surgery, also severe generalized osteoarthritis. #2  aortic stenosis, listed in 2011 over valve area at that time was 0.67 cm. In any case, she does not appear to be symptomatic now, probably no further investigations are indicated unless she develops symptoms  #3 type 2 diabetes on insulin. Last hemoglobin A1c was 8.9 on 1/16. Her Amaryl was discontinued and Lantus increased to 26 units she is now on NovoLog 5 units with all meals #4 chronic renal insufficiency with hypertension; last BUN of 19 and creatinine 0.52 This is a stable

## 2013-11-25 ENCOUNTER — Ambulatory Visit (INDEPENDENT_AMBULATORY_CARE_PROVIDER_SITE_OTHER): Payer: PRIVATE HEALTH INSURANCE

## 2013-11-25 VITALS — BP 166/82 | HR 60 | Resp 16

## 2013-11-25 DIAGNOSIS — E1140 Type 2 diabetes mellitus with diabetic neuropathy, unspecified: Secondary | ICD-10-CM

## 2013-11-25 DIAGNOSIS — M79609 Pain in unspecified limb: Secondary | ICD-10-CM

## 2013-11-25 DIAGNOSIS — M1990 Unspecified osteoarthritis, unspecified site: Secondary | ICD-10-CM

## 2013-11-25 DIAGNOSIS — B351 Tinea unguium: Secondary | ICD-10-CM

## 2013-11-25 NOTE — Progress Notes (Signed)
   Subjective:    Patient ID: Ashley Fritz, female    DOB: 08/19/1926, 80 y.o.   MRN: [id number]  HPI Comments: "Cut the toenails and check my heels"     Review of Systems no new systemic changes or findings noted     Objective:   Physical Exam Set 80 year old [place] female presents this time monitoring a wheelchair with caregiver for nursing facility patient presents this time alert for the most part oriented well-nourished although nonambulatory wearing diabetic shoes has a history of ulceration the heels however both heels have no active wounds or ulcerations there is evidence of previous ulcers are noted on the right heel. Patient has profound diabetes with neuropathy masker status unchanged pedal pulses DP +2/4 bilateral PT nonpalpable bilateral plus one +2 edema bilateral epicritic and proprioceptive sensations reveal significant absent epicritic sensation Semmes Weinstein testing to forefoot digits although hyperesthesias noted on attempted nail debridement of nails thick brittle crumbly friable yellow black and discolored tender on palpation and with enclosed shoes at times. No open wounds no secondary infections no discharge or drainage is noted patient does have is on Prozac Ultracet unchanged medications at this time recommend lotion for the heel daily also socks and shoes at all times       Assessment & Plan:  Assessment this time is ulcer ulceration heels resolved however there is neuropathy with diabetes and complications affecting both lower extremities thick dystrophic friable gratified mycotic nails 1 through 5 bilateral debridement at this time return in 3 months for continued palliative care maintain a coming shoes at all times as recommended lotion to the feet for dry skin into the heel area prevent recurrence of ulceration in reappointed 3 months for palliative nail care if needed  Alvan Dameichard Lelar Farewell DPM

## 2013-11-25 NOTE — Patient Instructions (Signed)
Diabetes and Foot Care Diabetes may cause you to have problems because of poor blood supply (circulation) to your feet and legs. This may cause the skin on your feet to become thinner, break easier, and heal more slowly. Your skin may become dry, and the skin may peel and crack. You may also have nerve damage in your legs and feet causing decreased feeling in them. You may not notice minor injuries to your feet that could lead to infections or more serious problems. Taking care of your feet is one of the most important things you can do for yourself.  HOME CARE INSTRUCTIONS  Wear shoes at all times, even in the house. Do not go barefoot. Bare feet are easily injured.  Check your feet daily for blisters, cuts, and redness. If you cannot see the bottom of your feet, use a mirror or ask someone for help.  Wash your feet with warm water (do not use hot water) and mild soap. Then pat your feet and the areas between your toes until they are completely dry. Do not soak your feet as this can dry your skin.  Apply a moisturizing lotion or petroleum jelly (that does not contain alcohol and is unscented) to the skin on your feet and to dry, brittle toenails. Do not apply lotion between your toes.  Trim your toenails straight across. Do not dig under them or around the cuticle. File the edges of your nails with an emery board or nail file.  Do not cut corns or calluses or try to remove them with medicine.  Wear clean socks or stockings every day. Make sure they are not too tight. Do not wear knee-high stockings since they may decrease blood flow to your legs.  Wear shoes that fit properly and have enough cushioning. To break in new shoes, wear them for just a few hours a day. This prevents you from injuring your feet. Always look in your shoes before you put them on to be sure there are no objects inside.  Do not cross your legs. This may decrease the blood flow to your feet.  If you find a minor scrape,  cut, or break in the skin on your feet, keep it and the skin around it clean and dry. These areas may be cleansed with mild soap and water. Do not cleanse the area with peroxide, alcohol, or iodine.  When you remove an adhesive bandage, be sure not to damage the skin around it.  If you have a wound, look at it several times a day to make sure it is healing.  Do not use heating pads or hot water bottles. They may burn your skin. If you have lost feeling in your feet or legs, you may not know it is happening until it is too late.  Make sure your health care provider performs a complete foot exam at least annually or more often if you have foot problems. Report any cuts, sores, or bruises to your health care provider immediately. SEEK MEDICAL CARE IF:   You have an injury that is not healing.  You have cuts or breaks in the skin.  You have an ingrown nail.  You notice redness on your legs or feet.  You feel burning or tingling in your legs or feet.  You have pain or cramps in your legs and feet.  Your legs or feet are numb.  Your feet always feel cold. SEEK IMMEDIATE MEDICAL CARE IF:   There is increasing redness,   swelling, or pain in or around a wound.  There is a red line that goes up your leg.  Pus is coming from a wound.  You develop a fever or as directed by your health care provider.  You notice a bad smell coming from an ulcer or wound. Document Released: 07/21/2000 Document Revised: 03/26/2013 Document Reviewed: 12/31/2012 [employer] Patient Information 2014 [employer].  

## 2013-12-30 ENCOUNTER — Non-Acute Institutional Stay (SKILLED_NURSING_FACILITY): Payer: PRIVATE HEALTH INSURANCE | Admitting: Internal Medicine

## 2013-12-30 DIAGNOSIS — I359 Nonrheumatic aortic valve disorder, unspecified: Secondary | ICD-10-CM

## 2013-12-30 DIAGNOSIS — E1129 Type 2 diabetes mellitus with other diabetic kidney complication: Secondary | ICD-10-CM

## 2013-12-30 DIAGNOSIS — I350 Nonrheumatic aortic (valve) stenosis: Secondary | ICD-10-CM

## 2013-12-30 DIAGNOSIS — M4802 Spinal stenosis, cervical region: Secondary | ICD-10-CM

## 2013-12-30 DIAGNOSIS — E1165 Type 2 diabetes mellitus with hyperglycemia: Secondary | ICD-10-CM

## 2013-12-30 NOTE — Progress Notes (Signed)
Patient ID: Ashley Fritz, female   DOB: 10/18/1926, 80 y.o.   MRN: [id number]           Facility [facility]. Chief complaints review of medical issues/routine [employer] monthly visit for April . History; patient has been in the  facility since April of 2012. Last in a hospital in February of 2012 and rehabilitation. She is known no cervical C3-C4 spondylosis with a herniated nucleus pulposus. She underwent surgery by Dr. Danielle DessElsner.she is able to propel herself in a  wheelchair in the facility. She is a very dependent for transfers and personal care. Otherwise she propels herself around the facility in a motorized wheelchair  Last lab work from 4/16 showed an essentially normal CBC. Comprehensive metabolic panel showed a BUN of 13 creatinine of 0.4. her hemoglobin A1c was 8.3   Active Ambulatory Problems      Diagnosis  Date Noted   .  DIABETES MELLITUS, TYPE II  02/06/2007   .  HYPERLIPIDEMIA  02/06/2007   .  HYPOALBUMINEMIA  04/20/2010   .  ANEMIA-NOS  04/20/2010   .  HYPERTENSION  02/06/2007   .  AORTIC STENOSIS/ INSUFFICIENCY, NON-RHEUMATIC  06/30/2009   .  CVA  06/10/2010   .  OSTEOARTHRITIS  02/06/2007   .  LOW BACK PAIN  04/20/2010   .  GAIT DISTURBANCE  06/10/2010   .  EDEMA  04/20/2010   .  MYELOPATHY, CERVICAL SPINE  08/26/2010   .  PRESSURE ULCER LOWER BACK  08/26/2010   .  UTI  08/26/2010      Resolved Ambulatory Problems      Diagnosis  Date Noted   .  No Resolved Ambulatory Problems      Past Medical History   Diagnosis  Date   .  Cataracts, bilateral        Past Surgical History   Procedure  Laterality  Date   .  Appendectomy       .  Abdominal hysterectomy       .  Oophorectomy       .  Anterior cervical decomp/discectomy fusion    09/2010       C3-4 for cord compression and HNP with myelopathy    Echocardiogram; I have reviewed this from 2011. She had an estimated valve area of 0.67 cm.   Medication list is reviewed. Lantus insulin 26 units every  night Nitroglycerin sublingual when necessary NovoLog 5 units with a.c. meal Norvasc 5 mg every morning Enteric-coated aspirin 81 daily Lasix 20 daily MiraLax 17 g daily Prilosec 40 mg daily Lidocaine 5% patch to right anterior shoulder and both knees Lessard and 100 mg daily Tylenol 325 2 tablets twice daily Neurontin 300 mg 3 times a day Lipitor 20 daily Vitamin D3 50,000 units monthly  Advanced directives; she is a full code   Physical;  Gen.; patient is up in her wheelchair conversational Vitals temperature 97.9-pulse 72-respirations 16-low pressure 140/66-weight 214 pounds-O2 sat is 96% on room air Respiratory clear entry bilaterally. Cardiac soft 2/6 systolic ejection murmur at the aortic area. This does not radiate. Musculoskeletal severe osteoarthritis, which is generalized She appears to have severe degenerative changes in both shoulders. Tender over the anterior shoulder compatible with a subacromial bursitis. Very limited range. She has tenderness over her left greater than right  Impression/plan #1 Maj. disability appears to be secondary to cervical spondylosis. She is status post surgery, also severe generalized osteoarthritis. #2  aortic stenosis, listed in 2011  over valve area at that time was 0.67 cm. In any case, she does not appear to be symptomatic now, probably no further investigations are indicated unless she develops symptoms. She is certainly too frail physically undergo major cardiac procedure #3 type 2 diabetes on insulin. Last hemoglobin A1c was 8.0 on 4/16. Her Amaryl was discontinued and Lantus increased to 26 units she is now on NovoLog 5 units with all meals. Her hemoglobin A1c has improved  This is a stable

## 2014-01-02 ENCOUNTER — Encounter: Payer: Self-pay | Admitting: *Deleted

## 2014-02-28 ENCOUNTER — Non-Acute Institutional Stay (SKILLED_NURSING_FACILITY): Payer: PRIVATE HEALTH INSURANCE | Admitting: Internal Medicine

## 2014-02-28 DIAGNOSIS — I359 Nonrheumatic aortic valve disorder, unspecified: Secondary | ICD-10-CM

## 2014-02-28 DIAGNOSIS — E1129 Type 2 diabetes mellitus with other diabetic kidney complication: Secondary | ICD-10-CM

## 2014-02-28 DIAGNOSIS — I350 Nonrheumatic aortic (valve) stenosis: Secondary | ICD-10-CM

## 2014-02-28 DIAGNOSIS — M4802 Spinal stenosis, cervical region: Secondary | ICD-10-CM

## 2014-02-28 DIAGNOSIS — E1165 Type 2 diabetes mellitus with hyperglycemia: Secondary | ICD-10-CM

## 2014-02-28 NOTE — Progress Notes (Signed)
Patient ID: Ashley Fritz, female   DOB: 04/01/1927, 80 y.o.   MRN: [id number]           Facility [facility]. Chief complaints review of medical issues/routine [employer] monthly visit for [place] . History; patient has been in the  facility since April of 2012. Last in a hospital in February of 2012 in rehabilitation. She is known no cervical C3-C4 spondylosis with a herniated nucleus pulposus. She underwent surgery by Dr. Danielle DessElsner.she is able to propel herself in a  wheelchair in the facility. She is a very dependent for transfers and personal care. Otherwise she propels herself around the facility in a motorized wheelchair  Issues include an increase in her Lantus to 28 units nightly this month. No other medical issues identified  Last lab work from 4/16 showed an essentially normal CBC. Comprehensive metabolic panel showed a BUN of 13 creatinine of 0.4. her hemoglobin A1c was 8.3   Active Ambulatory Problems      Diagnosis  Date Noted   .  DIABETES MELLITUS, TYPE II  02/06/2007   .  HYPERLIPIDEMIA  02/06/2007   .  HYPOALBUMINEMIA  04/20/2010   .  ANEMIA-NOS  04/20/2010   .  HYPERTENSION  02/06/2007   .  AORTIC STENOSIS/ INSUFFICIENCY, NON-RHEUMATIC  06/30/2009   .  CVA  06/10/2010   .  OSTEOARTHRITIS  02/06/2007   .  LOW BACK PAIN  04/20/2010   .  GAIT DISTURBANCE  06/10/2010   .  EDEMA  04/20/2010   .  MYELOPATHY, CERVICAL SPINE  08/26/2010   .  PRESSURE ULCER LOWER BACK  08/26/2010   .  UTI  08/26/2010      Resolved Ambulatory Problems      Diagnosis  Date Noted   .  No Resolved Ambulatory Problems      Past Medical History   Diagnosis  Date   .  Cataracts, bilateral        Past Surgical History   Procedure  Laterality  Date   .  Appendectomy       .  Abdominal hysterectomy       .  Oophorectomy       .  Anterior cervical decomp/discectomy fusion    09/2010       C3-4 for cord compression and HNP with myelopathy    Echocardiogram; I have reviewed this from 2011. She had  an estimated valve area of 0.67 cm.   Medication list is reviewed. Lantus insulin 28 units every night Nitroglycerin sublingual when necessary NovoLog 5 units with a.c. meal Norvasc 5 mg every morning Enteric-coated aspirin 81 daily Lasix 20 daily MiraLax 17 g daily Prilosec 40 mg daily Lidocaine 5% patch to right anterior shoulder and both knees Losartan  100 mg daily Tylenol 325 2 tablets twice daily Neurontin 300 mg 3 times a day Lipitor 20 daily Vitamin D3 50,000 units monthly  Advanced directives; she is a full code  Review of systems HEENT; complains of hearing loss in the right greater than left ear Respiratory she looks short of breath but does not complaining of shortness of breath or chest pain   Physical;  Vitals temperature 97.8-pulse 69-respirations 22-blood pressure 136/88-weight 220 pounds slightly up this month over last month O2 sat is 99% on room  HEENT; she has bilateral serum and right greater than left Respiratory clear entry bilaterally. Cardiac soft 2/6 systolic ejection murmur at the aortic area. This does not radiate. There is no obvious  heart Musculoskeletal severe osteoarthritis, which is generalized She appears to have severe degenerative changes in both shoulders. Tender over the anterior shoulder compatible with a subacromial bursitis. Very limited range. She has tenderness over her left greater than right  Impression/plan #1 Maj. disability appears to be secondary to cervical spondylosis. She is status post surgery, also severe generalized osteoarthritis. #2  aortic stenosis, listed in 2011 over valve area at that time was 0.67 cm. In any case, she does not appear to be symptomatic now, probably no further investigations are indicated unless she develops symptoms. She is certainly too frail physically undergo major cardiac procedure #3 type 2 diabetes on insulin. Last hemoglobin A1c was 8.0 on 4/16. Her Amaryl was discontinued and Lantus increased  to 28 units she is now on NovoLog 5 units with all meals. Cerumen impaction I will prescribe Debrox otic  I think her shortness of breath will need to be followed. I could find no evidence of heart failure currently however her weight is increased.

## 2014-03-03 ENCOUNTER — Ambulatory Visit (INDEPENDENT_AMBULATORY_CARE_PROVIDER_SITE_OTHER): Payer: PRIVATE HEALTH INSURANCE

## 2014-03-03 DIAGNOSIS — E1140 Type 2 diabetes mellitus with diabetic neuropathy, unspecified: Secondary | ICD-10-CM

## 2014-03-03 DIAGNOSIS — E1141 Type 2 diabetes mellitus with diabetic mononeuropathy: Secondary | ICD-10-CM

## 2014-03-03 DIAGNOSIS — E1149 Type 2 diabetes mellitus with other diabetic neurological complication: Secondary | ICD-10-CM

## 2014-03-03 DIAGNOSIS — M79609 Pain in unspecified limb: Secondary | ICD-10-CM

## 2014-03-03 DIAGNOSIS — M150 Primary generalized (osteo)arthritis: Secondary | ICD-10-CM

## 2014-03-03 DIAGNOSIS — E1142 Type 2 diabetes mellitus with diabetic polyneuropathy: Secondary | ICD-10-CM

## 2014-03-03 DIAGNOSIS — M159 Polyosteoarthritis, unspecified: Secondary | ICD-10-CM

## 2014-03-03 DIAGNOSIS — B351 Tinea unguium: Secondary | ICD-10-CM

## 2014-03-03 DIAGNOSIS — M79606 Pain in leg, unspecified: Secondary | ICD-10-CM

## 2014-03-03 NOTE — Progress Notes (Signed)
   Subjective:    Patient ID: Ashley Fritz, female    DOB: 11/12/1926, 80 y.o.   MRN: [id number]  HPI patient presents at this time for 3 month followup diabetic foot and nail care. Patient is nonambulatory in a wheelchair in a skilled facility.    Review of Systems no new findings or systemic changes noted no significant changes in medication her health history     Objective:   Physical Exam  Lower extremity objective findings as follows a 80-year-old African American female again nonambulatory in the wheelchair wearing diabetic shoes and compression stockings. The ulcerations of heels have resolved no active ulcers all the dry skin is identified. Has pedal pulses DP +2/4 PT nonpalpable bilateral absent hair growth plus one edema noted bilateral epicritic sensations diminished on Semmes Weinstein testing to forefoot digits the patient describes severe hyperesthesia to her feet toes and legs in particular on attempted debridement at this time. Nails thick brittle crumbly friable dystrophic 1 through 5 bilateral no signs of infection or open wounds noted at this time      Assessment & Plan:  Assessment diabetes with history peripheral neuropathy history of ulceration currently stable nails 1 through 5 bilateral debrided and presence of pain symptomatology return for future palliative nail care every 3 months as recommended maintain appropriate accommodative shoes compression stockings her diabetic socks. Patient has been soaking her wash your feet every day advised her normal bathing and showering 2-3 times a week is adequate she is 89 do any extra foot soaks and dry her skin excessively. Reappointed 3 months as recommended  Alvan Dameichard Tensley Wery DPM

## 2014-03-03 NOTE — Progress Notes (Signed)
   Subjective:    Patient ID: Ashley Fritz, female    DOB: 06/07/1927, 80 y.o.   MRN: [id number]  HPI Comments: Pt presents for debridement 10 toenails.     Review of Systems     Objective:   Physical Exam        Assessment & Plan:

## 2014-03-03 NOTE — Patient Instructions (Signed)
Diabetes and Foot Care Diabetes may cause you to have problems because of poor blood supply (circulation) to your feet and legs. This may cause the skin on your feet to become thinner, break easier, and heal more slowly. Your skin may become dry, and the skin may peel and crack. You may also have nerve damage in your legs and feet causing decreased feeling in them. You may not notice minor injuries to your feet that could lead to infections or more serious problems. Taking care of your feet is one of the most important things you can do for yourself.  HOME CARE INSTRUCTIONS  Wear shoes at all times, even in the house. Do not go barefoot. Bare feet are easily injured.  Check your feet daily for blisters, cuts, and redness. If you cannot see the bottom of your feet, use a mirror or ask someone for help.  Wash your feet with warm water (do not use hot water) and mild soap. Then pat your feet and the areas between your toes until they are completely dry. Do not soak your feet as this can dry your skin.  Apply a moisturizing lotion or petroleum jelly (that does not contain alcohol and is unscented) to the skin on your feet and to dry, brittle toenails. Do not apply lotion between your toes.  Trim your toenails straight across. Do not dig under them or around the cuticle. File the edges of your nails with an emery board or nail file.  Do not cut corns or calluses or try to remove them with medicine.  Wear clean socks or stockings every day. Make sure they are not too tight. Do not wear knee-high stockings since they may decrease blood flow to your legs.  Wear shoes that fit properly and have enough cushioning. To break in new shoes, wear them for just a few hours a day. This prevents you from injuring your feet. Always look in your shoes before you put them on to be sure there are no objects inside.  Do not cross your legs. This may decrease the blood flow to your feet.  If you find a minor scrape,  cut, or break in the skin on your feet, keep it and the skin around it clean and dry. These areas may be cleansed with mild soap and water. Do not cleanse the area with peroxide, alcohol, or iodine.  When you remove an adhesive bandage, be sure not to damage the skin around it.  If you have a wound, look at it several times a day to make sure it is healing.  Do not use heating pads or hot water bottles. They may burn your skin. If you have lost feeling in your feet or legs, you may not know it is happening until it is too late.  Make sure your health care provider performs a complete foot exam at least annually or more often if you have foot problems. Report any cuts, sores, or bruises to your health care provider immediately. SEEK MEDICAL CARE IF:   You have an injury that is not healing.  You have cuts or breaks in the skin.  You have an ingrown nail.  You notice redness on your legs or feet.  You feel burning or tingling in your legs or feet.  You have pain or cramps in your legs and feet.  Your legs or feet are numb.  Your feet always feel cold. SEEK IMMEDIATE MEDICAL CARE IF:   There is increasing redness,   swelling, or pain in or around a wound.  There is a red line that goes up your leg.  Pus is coming from a wound.  You develop a fever or as directed by your health care provider.  You notice a bad smell coming from an ulcer or wound. Document Released: 07/21/2000 Document Revised: 03/26/2013 Document Reviewed: 12/31/2012 [employer] Patient Information 2015 [employer]. This information is not intended to replace advice given to you by your health care provider. Make sure you discuss any questions you have with your health care provider.  

## 2014-03-27 ENCOUNTER — Non-Acute Institutional Stay (SKILLED_NURSING_FACILITY): Payer: PRIVATE HEALTH INSURANCE | Admitting: Internal Medicine

## 2014-03-27 DIAGNOSIS — I350 Nonrheumatic aortic (valve) stenosis: Secondary | ICD-10-CM

## 2014-03-27 DIAGNOSIS — E1129 Type 2 diabetes mellitus with other diabetic kidney complication: Secondary | ICD-10-CM

## 2014-03-27 DIAGNOSIS — I359 Nonrheumatic aortic valve disorder, unspecified: Secondary | ICD-10-CM

## 2014-03-27 DIAGNOSIS — M4802 Spinal stenosis, cervical region: Secondary | ICD-10-CM

## 2014-03-27 DIAGNOSIS — E1165 Type 2 diabetes mellitus with hyperglycemia: Secondary | ICD-10-CM

## 2014-03-28 NOTE — Progress Notes (Signed)
Patient ID: Ashley Fritz, female   DOB: 02-01-1927, 80 y.o.   MRN: [id number]           Facility [facility]. Chief complaints review of medical issues/routine [employer] monthly visit for July . History; patient has been in the  facility since April of 2012. Last in a hospital in February of 2012 in rehabilitation. She is known no cervical C3-C4 spondylosis with a herniated nucleus pulposus. She underwent surgery by Dr. Danielle Dess.she is able to propel herself in a  wheelchair in the facility. She is a very dependent for transfers and personal care. Otherwise she propels herself around the facility in a motorized wheelchair  Issues include an increase in her Lantus to 30 units nightly this month. No other medical issues identified  Last labs from; 7/23; CBC showed a white count of 4.0 hemoglobin of 11.4 normal differential CMP; BUN of 11 creatinine of 0.5 sodium of 145 LDL of 71 hemoglobin A1c of 8.3 TSH of 0.94   Active Ambulatory Problems      Diagnosis  Date Noted   .  DIABETES MELLITUS, TYPE II  02/06/2007   .  HYPERLIPIDEMIA  02/06/2007   .  HYPOALBUMINEMIA  04/20/2010   .  ANEMIA-NOS  04/20/2010   .  HYPERTENSION  02/06/2007   .  AORTIC STENOSIS/ INSUFFICIENCY, NON-RHEUMATIC  06/30/2009   .  CVA  06/10/2010   .  OSTEOARTHRITIS  02/06/2007   .  LOW BACK PAIN  04/20/2010   .  GAIT DISTURBANCE  06/10/2010   .  EDEMA  04/20/2010   .  MYELOPATHY, CERVICAL SPINE  08/26/2010   .  PRESSURE ULCER LOWER BACK  08/26/2010   .  UTI  08/26/2010      Resolved Ambulatory Problems      Diagnosis  Date Noted   .  No Resolved Ambulatory Problems      Past Medical History   Diagnosis  Date   .  Cataracts, bilateral        Past Surgical History   Procedure  Laterality  Date   .  Appendectomy       .  Abdominal hysterectomy       .  Oophorectomy       .  Anterior cervical decomp/discectomy fusion    09/2010       C3-4 for cord compression and HNP with myelopathy    Echocardiogram; I have  reviewed this from 2011. She had an estimated valve area of 0.67 cm.   Medication list is reviewed. Lantus insulin 30 units every night Nitroglycerin sublingual when necessary NovoLog 5 units with a.c. meal Norvasc 5 mg every morning Enteric-coated aspirin 81 daily Lasix 20 daily MiraLax 17 g daily Prilosec 40 mg daily Lidocaine 5% patch to right anterior shoulder and both knees Losartan  100 mg daily Tylenol 325 2 tablets twice daily Neurontin 300 mg 3 times a day Lipitor 20 daily Vitamin D3 50,000 units monthly  Advanced directives; she is a full code  Review of systems HEENT; complains of hearing loss in the right greater than left ear Respiratory she looks short of breath but does not complaining of shortness of breath or chest pain   Physical;  Vitals temperature 97.8-pulse 69-respirations 22-blood pressure 136/88-weight 220 pounds slightly up this month over last month O2 sat is 99% on room  HEENT; she has bilateral serum and right greater than left Respiratory clear entry bilaterally. Cardiac soft 2/6 systolic ejection murmur at the  aortic area. This does not radiate. There is no obvious heart Musculoskeletal severe osteoarthritis, which is generalized She appears to have severe degenerative changes in both shoulders. Tender over the anterior shoulder compatible with a subacromial bursitis. Very limited range. She has tenderness over her left greater than right  Impression/plan #1 Maj. disability appears to be secondary to cervical spondylosis. She is status post surgery, also severe generalized osteoarthritis. #2  aortic stenosis, listed in 2011 over valve area at that time was 0.67 cm. In any case, she does not appear to be symptomatic now, probably no further investigations are indicated unless she develops symptoms. She is certainly too frail physically undergo major cardiac procedure #3 type 2 diabetes on insulin. Last hemoglobin A1c was 8.3 on 4/16. Her Amaryl was  discontinued and Lantus increased to 30 units she is now on NovoLog 5 units with all meals. Cerumen impaction I will prescribe Debrox otic  I think her shortness of breath will need to be followed. I could find no evidence of heart failure currently however her weight is increased. The source of her major disability is her cervical spondylosis with myelopathy

## 2014-04-06 ENCOUNTER — Encounter: Payer: Self-pay | Admitting: Internal Medicine

## 2014-05-02 ENCOUNTER — Non-Acute Institutional Stay (SKILLED_NURSING_FACILITY): Payer: PRIVATE HEALTH INSURANCE | Admitting: Internal Medicine

## 2014-05-02 DIAGNOSIS — I350 Nonrheumatic aortic (valve) stenosis: Secondary | ICD-10-CM

## 2014-05-02 DIAGNOSIS — E1129 Type 2 diabetes mellitus with other diabetic kidney complication: Secondary | ICD-10-CM

## 2014-05-02 DIAGNOSIS — M4802 Spinal stenosis, cervical region: Secondary | ICD-10-CM

## 2014-05-02 DIAGNOSIS — E1165 Type 2 diabetes mellitus with hyperglycemia: Secondary | ICD-10-CM

## 2014-05-02 DIAGNOSIS — I359 Nonrheumatic aortic valve disorder, unspecified: Secondary | ICD-10-CM

## 2014-05-02 NOTE — Progress Notes (Signed)
Patient ID: Ashley Fritz, female   DOB: 1927-05-25, 80 y.o.   MRN: [id number]           Facility [facility]. Chief complaints review of medical issues History; patient has been in the  facility since April of 2012. Last in a hospital in February of 2012 in rehabilitation. She is known no cervical C3-C4 spondylosis with a herniated nucleus pulposus. She underwent surgery by Dr. Danielle Dess.she is able to propel herself in a  wheelchair in the facility. She is a very dependent for transfers and personal care. Otherwise she propels herself around the facility in a motorized wheelchair  Issues include an increase in her Lantus to 30 units nightly this month. No other medical issues identified  Last labs from; 7/23; CBC showed a white count of 4.0 hemoglobin of 11.4 normal differential CMP; BUN of 11 creatinine of 0.5 sodium of 145 LDL of 71 hemoglobin A1c of 8.3 TSH of 0.94   Active Ambulatory Problems      Diagnosis  Date Noted   .  DIABETES MELLITUS, TYPE II  02/06/2007   .  HYPERLIPIDEMIA  02/06/2007   .  HYPOALBUMINEMIA  04/20/2010   .  ANEMIA-NOS  04/20/2010   .  HYPERTENSION  02/06/2007   .  AORTIC STENOSIS/ INSUFFICIENCY, NON-RHEUMATIC  06/30/2009   .  CVA  06/10/2010   .  OSTEOARTHRITIS  02/06/2007   .  LOW BACK PAIN  04/20/2010   .  GAIT DISTURBANCE  06/10/2010   .  EDEMA  04/20/2010   .  MYELOPATHY, CERVICAL SPINE  08/26/2010   .  PRESSURE ULCER LOWER BACK  08/26/2010   .  UTI  08/26/2010      Resolved Ambulatory Problems      Diagnosis  Date Noted   .  No Resolved Ambulatory Problems      Past Medical History   Diagnosis  Date   .  Cataracts, bilateral        Past Surgical History   Procedure  Laterality  Date   .  Appendectomy       .  Abdominal hysterectomy       .  Oophorectomy       .  Anterior cervical decomp/discectomy fusion    09/2010       C3-4 for cord compression and HNP with myelopathy    Echocardiogram; I have reviewed this from 2011. She had an  estimated valve area of 0.67 cm.   Medication list is reviewed. Lantus insulin 30 units every night Nitroglycerin sublingual when necessary NovoLog 5 units with a.c. meal Norvasc 5 mg every morning Enteric-coated aspirin 81 daily Lasix 20 daily MiraLax 17 g daily Prilosec 40 mg daily Lidocaine 5% patch to right anterior shoulder and both knees Losartan  100 mg daily Tylenol 325 2 tablets twice daily Neurontin 300 mg 3 times a day Lipitor 20 daily Vitamin D3 50,000 units monthly  Advanced directives; she is a full code  Review of systems HEENT; complains of hearing loss in the right greater than left ear. She has been for her annual diabetic eye exam. She was newly diagnosed with open-angle glaucoma. She is now on latanoprost drops Respiratory she looks short of breath but does not complaining of shortness of breath or chest pain Musculoskeletal complaining of right greater than left foot pain   Physical;  Vitals temperature 98-pulse 74-respirations 22-blood press-weight 221 pounds slightly up this month over last month O2 sat is 97% on  room  Respiratory clear entry bilaterally. Cardiac soft 2/6 systolic ejection murmur at the aortic area. This does not radiate. There is no obvious heart Musculoskeletal severe osteoarthritis, which is generalized She appears to have severe degenerative changes in both shoulders. Tender over the anterior shoulder compatible with a subacromial bursitis. Very limited range. She has tenderness over her left greater than right Foot exam; she does not have any open wounds. Peripheral pulses are reduced however her feet are warm. She probably has some degree of diabetic neuropathy although the pain in her heel seems mostly at the insertion site of the Achilles tendon on the right to. She is also tender over the lateral malleolus on the right. There are no open wounds in her feet.  Impression/plan #1 Maj. disability appears to be secondary to cervical  spondylosis. She is status post surgery, also severe generalized osteoarthritis. #2  aortic stenosis, listed in 2011 over valve area at that time was 0.67 cm. In any case, she does not appear to be symptomatic now, probably no further investigations are indicated unless she develops symptoms. She is certainly too frail physically undergo major cardiac procedure #3 type 2 diabetes on insulin. Last hemoglobin A1c was 8.3 on 4/16. Her Amaryl was discontinued and Lantus increased to 30 units she is now on NovoLog 5 units with all meals. She has background diabetic retinopathy as well as neb new open-angle glaucoma. #4 right greater than left foot pain. I'm not exactly sure of etiology of this. Any degree of PAD she has does not seem severe enough to of resulted in this. She probably has some degree of diabetic neuropathy. She follows with podiatry  I think her shortness of breath will need to be followed. I could find no evidence of heart failure currently however her weight is increased. The source of her major disability is her cervical spondylosis with myelopathy. Her insulin was adjusted she will due for a hemoglobin A1c next month

## 2014-06-05 ENCOUNTER — Ambulatory Visit: Payer: PRIVATE HEALTH INSURANCE

## 2014-06-16 ENCOUNTER — Ambulatory Visit (INDEPENDENT_AMBULATORY_CARE_PROVIDER_SITE_OTHER): Payer: PRIVATE HEALTH INSURANCE

## 2014-06-16 DIAGNOSIS — E1140 Type 2 diabetes mellitus with diabetic neuropathy, unspecified: Secondary | ICD-10-CM

## 2014-06-16 DIAGNOSIS — M79606 Pain in leg, unspecified: Secondary | ICD-10-CM

## 2014-06-16 DIAGNOSIS — B351 Tinea unguium: Secondary | ICD-10-CM

## 2014-06-16 DIAGNOSIS — E1141 Type 2 diabetes mellitus with diabetic mononeuropathy: Secondary | ICD-10-CM

## 2014-06-16 DIAGNOSIS — L97401 Non-pressure chronic ulcer of unspecified heel and midfoot limited to breakdown of skin: Secondary | ICD-10-CM

## 2014-06-16 NOTE — Progress Notes (Signed)
   Subjective:    Patient ID: Ashley Fritz, female    DOB: 11/03/1926, 80 y.o.   MRN: [id number]  HPIpatient presents at this time for diabetic foot and nail care reevaluation is not endotracheal wheelchair with significant edema both lower extremities    Review of Systemsno new findings or systemic changes noted +2 edema both feet also pre-ulcerative pressure ulcer posterior right heels noted with some mild ecchymosis no active discharge or drainage posse just some very superficial ecchymosis or early stage I pressure ulcer on the posterior right heel noted accommodations for foam or cushioned heel booties to be worn when in the wheelchair or in bed     Objective:   Physical Exam Lower extremity objective findings reveal vascular status intact DP +2 PT plus water thready nonpalpable bilateral +2 edema noted bilateral difficult to palpate with the edema absent hair growth Semmes Weinstein testing reveals absent epicritic and proprioceptive sensations to the forefoot toes increased vibratory sensation noted as well there is normal plantar response DTRs not listed dermatologic the skin color pigment normal hair growth absent pre-ulcer posterior right heel is a pressure ulcer identified at this time no active discharge or drainage recommended accommodation padding and lotion also at this time debridement of thick brittle chromic dystrophic nails 1 through 5 bilateral carried out return in 3 months for follow-up and continued diabetic foot and palliative nail care       Assessment & Plan:  Assessment diabetes history peripheral neuropathy and angiopathy noted this time pre-ulcerative the heels identified recommended heel cup her foam heel cushions. Nails debrided 1 through 5 bilateral return for 3 month follow-up and palliative nail care as recommended   f Alvan Dameichard Manning Luna DPM

## 2014-06-16 NOTE — Patient Instructions (Signed)
ANTIBACTERIAL SOAP INSTRUCTIONS  THE DAY AFTER PROCEDURE  Please follow the instructions your doctor has marked.   Shower as usual. Before getting out, place a drop of antibacterial liquid soap (Dial) on a wet, clean washcloth.  Gently wipe washcloth over affected area.  Afterward, rinse the area with warm water.  Blot the area dry with a soft cloth and cover with antibiotic ointment (neosporin, polysporin, bacitracin) and band aid or gauze and tape  Place 3-4 drops of antibacterial liquid soap in a quart of warm tap water.  Submerge foot into water for 20 minutes.  If bandage was applied after your procedure, leave on to allow for easy lift off, then remove and continue with soak for the remaining time.  Next, blot area dry with a soft cloth and cover with a bandage.  Apply other medications as directed by your doctor, such as cortisporin otic solution (eardrops) or neosporin antibiotic ointment  Recommendation is to wash or bathe foot in soap and water there is a pre-ulcerative area on the posterior right heel or bruises or ecchymosis would benefit from a cushioned heel cup or bootie foam booties her heel cushions would be beneficial especially when in a wheelchair or laying in bed to help offload pressure from the back of the heel and prevent a pressure sore /ulcer  Consider applying some Neosporin or lotion to the posterior heel area keep the area lubricated and again wear to a cushioned pad or heel cup at all times next  Standard Pacificichard Rakeb Kibble DPM

## 2014-09-15 ENCOUNTER — Ambulatory Visit: Payer: PRIVATE HEALTH INSURANCE

## 2014-09-25 ENCOUNTER — Ambulatory Visit: Payer: Medicaid Other | Admitting: Podiatry

## 2014-10-09 ENCOUNTER — Ambulatory Visit (INDEPENDENT_AMBULATORY_CARE_PROVIDER_SITE_OTHER): Payer: Medicare Other | Admitting: Podiatry

## 2014-10-09 DIAGNOSIS — M79606 Pain in leg, unspecified: Secondary | ICD-10-CM | POA: Diagnosis not present

## 2014-10-09 DIAGNOSIS — B351 Tinea unguium: Secondary | ICD-10-CM

## 2014-10-09 DIAGNOSIS — E1140 Type 2 diabetes mellitus with diabetic neuropathy, unspecified: Secondary | ICD-10-CM

## 2014-10-11 NOTE — Progress Notes (Signed)
Patient ID: Ashley CatholicGladys J Recine, female   DOB: 02/18/1927, 81 y.o.   MRN: [id number]  Subjective: 81 y.o.-year-old female returns the office today for painful, elongated, thickened toenails. She denies any redness or drainage around the nails. Denies any acute changes since last appointment and no new complaints today. Denies any systemic complaints such as fevers, chills, nausea, vomiting.   Objective: AAO 3, NAD DP pulses palpable bilaterally, PT pulses decreased;  CRT less than 3 seconds Chronic bilateral lower extremity edema  Protective sensation absent with Dorann OuSimms Weinstein monofilament, Achilles tendon reflex intact.  Nails hypertrophic, dystrophic, elongated, brittle, discolored 10. There is tenderness overlying these nails 1-5 bilatearlly. There is no surrounding erythema or drainage along the nail sites. There is a pre-ulcerative area on the right heel from pressure. There is no open lesion at this time although there is evidence of increased pressure.  No open lesions or other pre-ulcerative lesions are identified. No other areas of tenderness bilateral lower extremities. No overlying edema, erythema, increased warmth. No pain with calf compression, swelling, warmth, erythema.  Assessment: Patient presents with symptomatic onychomycosis; pre-ulcerative lesion right heel.   Plan: -Treatment options including alternatives, risks, complications were discussed -Nails sharply debrided 10 without complication/bleeding. -Due to pre-ulcerative lesion recommended multipodis boots to help alleviate pressure. I wrote for this on the patients paperwork for her facility.  -Discussed daily foot inspection. If there are any changes, to call the office immediately.  -Follow-up in 3 months or sooner if any problems are to arise. In the meantime, encouraged to call the office with any questions, concerns, changes symptoms.

## 2015-01-12 ENCOUNTER — Ambulatory Visit: Payer: Medicare Other

## 2015-01-12 ENCOUNTER — Other Ambulatory Visit: Payer: Self-pay | Admitting: Internal Medicine

## 2015-01-19 ENCOUNTER — Other Ambulatory Visit: Payer: Self-pay | Admitting: Internal Medicine

## 2015-02-01 ENCOUNTER — Other Ambulatory Visit (HOSPITAL_COMMUNITY): Payer: Self-pay | Admitting: Internal Medicine

## 2015-02-01 DIAGNOSIS — I517 Cardiomegaly: Secondary | ICD-10-CM

## 2015-02-03 ENCOUNTER — Ambulatory Visit (HOSPITAL_COMMUNITY)
Admission: RE | Admit: 2015-02-03 | Discharge: 2015-02-03 | Disposition: A | Discharge status: Home / Self Care | Payer: Medicaid Other | Source: Ambulatory Visit | Attending: Internal Medicine | Admitting: Internal Medicine

## 2015-02-03 ENCOUNTER — Other Ambulatory Visit (HOSPITAL_COMMUNITY): Payer: Self-pay | Admitting: Internal Medicine

## 2015-02-03 DIAGNOSIS — I2510 Atherosclerotic heart disease of native coronary artery without angina pectoris: Secondary | ICD-10-CM | POA: Insufficient documentation

## 2015-02-03 DIAGNOSIS — I517 Cardiomegaly: Secondary | ICD-10-CM

## 2015-02-03 DIAGNOSIS — K449 Diaphragmatic hernia without obstruction or gangrene: Secondary | ICD-10-CM | POA: Insufficient documentation

## 2015-02-03 DIAGNOSIS — I700 Atherosclerosis of aorta: Secondary | ICD-10-CM | POA: Insufficient documentation

## 2015-02-03 DIAGNOSIS — K219 Gastro-esophageal reflux disease without esophagitis: Secondary | ICD-10-CM | POA: Diagnosis not present

## 2015-02-03 LAB — POCT I-STAT, CHEM 8
BUN: 17 mg/dL (ref 6–20)
CHLORIDE: 95 mmol/L — AB (ref 101–111)
Calcium, Ion: 1.26 mmol/L (ref 1.13–1.30)
Creatinine, Ser: 0.6 mg/dL (ref 0.44–1.00)
Glucose, Bld: 115 mg/dL — ABNORMAL HIGH (ref 65–99)
HCT: 38 % (ref 36.0–46.0)
Hemoglobin: 12.9 g/dL (ref 12.0–15.0)
Potassium: 4.6 mmol/L (ref 3.5–5.1)
Sodium: 141 mmol/L (ref 135–145)
TCO2: 38 mmol/L (ref 0–100)

## 2015-02-03 MED ORDER — IOHEXOL 300 MG/ML  SOLN
75.0000 mL | Freq: Once | INTRAMUSCULAR | Status: AC | PRN
  Administered 2015-02-03: 75 mL via INTRAVENOUS

## 2015-02-04 ENCOUNTER — Encounter: Payer: Self-pay | Admitting: Podiatry

## 2015-02-04 ENCOUNTER — Ambulatory Visit (INDEPENDENT_AMBULATORY_CARE_PROVIDER_SITE_OTHER): Payer: Medicare Other | Admitting: Podiatry

## 2015-02-04 DIAGNOSIS — M79606 Pain in leg, unspecified: Secondary | ICD-10-CM

## 2015-02-04 DIAGNOSIS — E1140 Type 2 diabetes mellitus with diabetic neuropathy, unspecified: Secondary | ICD-10-CM

## 2015-02-04 DIAGNOSIS — B351 Tinea unguium: Secondary | ICD-10-CM

## 2015-02-04 NOTE — Progress Notes (Signed)
Patient ID: Ashley CatholicGladys J Fritz, female   DOB: 03/06/1927, 81 y.o.   MRN: [id number] HPI  Complaint:  Visit Type: Patient returns to my office for continued preventative foot care services. Complaint: Patient states" my nails have grown long and thick and become painful to walk and wear shoes" Patient has been diagnosed with DM with vascular complications. She presents for preventative foot care services. No changes to ROS  Podiatric Exam: Vascular: dorsalis pedis and posterior tibial pulses are negative. Capillary return is diminished Temperature gradient is negative. Skin turgor WNL, bilateral swelling  Sensorium: Absent  Semmes Weinstein monofilament test. .  Nail Exam: Pt has thick disfigured discolored nails with subungual debris noted bilateral entire nail hallux through fifth toenails Ulcer Exam: There is  evidence of  pre-ulcerative changes right heel. Orthopedic Exam: Muscle tone and strength are WNL. No limitations in general ROM. No crepitus or effusions noted. Foot type and digits show no abnormalities. Bony prominences are unremarkable. Skin: No Porokeratosis. No infection or ulcers  Diagnosis:  Tinea unguium, Pain in right toe, pain in left toes  Treatment & Plan Procedures and Treatment: Consent by patient was obtained for treatment procedures. The patient understood the discussion of treatment and procedures well. All questions were answered thoroughly reviewed. Debridement of mycotic and hypertrophic toenails, 1 through 5 bilateral and clearing of subungual debris. No ulceration, no infection noted.  Return Visit-Office Procedure: Patient instructed to return to the office for a follow up visit 3 months for continued evaluation and treatment.

## 2015-02-10 ENCOUNTER — Other Ambulatory Visit (HOSPITAL_COMMUNITY): Payer: Self-pay | Admitting: Internal Medicine

## 2015-02-10 DIAGNOSIS — I517 Cardiomegaly: Secondary | ICD-10-CM

## 2015-05-13 ENCOUNTER — Encounter: Payer: Self-pay | Admitting: Podiatry

## 2015-05-13 ENCOUNTER — Ambulatory Visit (INDEPENDENT_AMBULATORY_CARE_PROVIDER_SITE_OTHER): Payer: Medicare Other | Admitting: Podiatry

## 2015-05-13 DIAGNOSIS — E1140 Type 2 diabetes mellitus with diabetic neuropathy, unspecified: Secondary | ICD-10-CM | POA: Diagnosis not present

## 2015-05-13 DIAGNOSIS — M79606 Pain in leg, unspecified: Secondary | ICD-10-CM

## 2015-05-13 DIAGNOSIS — B351 Tinea unguium: Secondary | ICD-10-CM

## 2015-05-13 NOTE — Progress Notes (Signed)
Patient ID: Ashley Fritz, female   DOB: 03/03/1927, 81 y.o.   MRN: [id number] HPI  Complaint:  Visit Type: Patient returns to my office for continued preventative foot care services. Complaint: Patient states" my nails have grown long and thick and become painful to walk and wear shoes" Patient has been diagnosed with DM with vascular complications. She presents for preventative foot care services. No changes to ROS  Podiatric Exam: Vascular: dorsalis pedis and posterior tibial pulses are negative. Capillary return is diminished Temperature gradient is negative. Skin turgor WNL, bilateral swelling  Sensorium: Absent  Semmes Weinstein monofilament test. .  Nail Exam: Pt has thick disfigured discolored nails with subungual debris noted bilateral entire nail hallux through fifth toenails Ulcer Exam: There is  evidence of  pre-ulcerative changes right heel. Orthopedic Exam: Muscle tone and strength are WNL. No limitations in general ROM. No crepitus or effusions noted. Foot type and digits show no abnormalities. Bony prominences are unremarkable. Skin: No Porokeratosis. No infection or ulcers  Diagnosis:  Tinea unguium, Pain in right toe, pain in left toes  Treatment & Plan Procedures and Treatment: Consent by patient was obtained for treatment procedures. The patient understood the discussion of treatment and procedures well. All questions were answered thoroughly reviewed. Debridement of mycotic and hypertrophic toenails, 1 through 5 bilateral and clearing of subungual debris. No ulceration, no infection noted.  Return Visit-Office Procedure: Patient instructed to return to the office for a follow up visit 3 months for continued evaluation and treatment.

## 2015-08-11 ENCOUNTER — Ambulatory Visit: Payer: Medicare Other | Admitting: Podiatry

## 2015-08-13 ENCOUNTER — Ambulatory Visit (INDEPENDENT_AMBULATORY_CARE_PROVIDER_SITE_OTHER): Payer: Medicare Other | Admitting: Podiatry

## 2015-08-13 ENCOUNTER — Encounter: Payer: Self-pay | Admitting: Podiatry

## 2015-08-13 DIAGNOSIS — E1140 Type 2 diabetes mellitus with diabetic neuropathy, unspecified: Secondary | ICD-10-CM | POA: Diagnosis not present

## 2015-08-13 DIAGNOSIS — M79606 Pain in leg, unspecified: Secondary | ICD-10-CM

## 2015-08-13 DIAGNOSIS — B351 Tinea unguium: Secondary | ICD-10-CM

## 2015-08-13 NOTE — Progress Notes (Signed)
Patient ID: [name], female   DOB: 06/09/1927, 82 y.o.   MRN: [id number] HPI  Complaint:  Visit Type: Patient returns to my office for continued preventative foot care services. Complaint: Patient states" my nails have grown long and thick and become painful to walk and wear shoes" Patient has been diagnosed with DM with vascular complications. She presents for preventative foot care services. No changes to ROS  Podiatric Exam: Vascular: dorsalis pedis and posterior tibial pulses are negative. Capillary return is diminished Temperature gradient is negative. Skin turgor WNL, bilateral swelling  Sensorium: Absent  Semmes Weinstein monofilament test. .  Nail Exam: Pt has thick disfigured discolored nails with subungual debris noted bilateral entire nail hallux through fifth toenails Ulcer Exam: There is  evidence of  pre-ulcerative changes right heel. Orthopedic Exam: Muscle tone and strength are WNL. No limitations in general ROM. No crepitus or effusions noted. Foot type and digits show no abnormalities. Bony prominences are unremarkable. Skin: No Porokeratosis. No infection or ulcers  Diagnosis:  Tinea unguium, Pain in right toe, pain in left toes  Treatment & Plan Procedures and Treatment: Consent by patient was obtained for treatment procedures. The patient understood the discussion of treatment and procedures well. All questions were answered thoroughly reviewed. Debridement of mycotic and hypertrophic toenails, 1 through 5 bilateral and clearing of subungual debris. No ulceration, no infection noted. She responded to her toenails right foot.  Additional swelling right foot.  Better treatment of right toenails was performed.Related to her CVA. Return Visit-Office Procedure: Patient instructed to return to the office for a follow up visit 10 weeks  for continued evaluation and treatment.   Helane GuntherGregory Blayde Bacigalupi DPM

## 2015-10-29 ENCOUNTER — Ambulatory Visit (INDEPENDENT_AMBULATORY_CARE_PROVIDER_SITE_OTHER): Payer: Medicare Other | Admitting: Podiatry

## 2015-10-29 ENCOUNTER — Encounter: Payer: Self-pay | Admitting: Podiatry

## 2015-10-29 DIAGNOSIS — M79606 Pain in leg, unspecified: Secondary | ICD-10-CM

## 2015-10-29 DIAGNOSIS — E1140 Type 2 diabetes mellitus with diabetic neuropathy, unspecified: Secondary | ICD-10-CM | POA: Diagnosis not present

## 2015-10-29 DIAGNOSIS — B351 Tinea unguium: Secondary | ICD-10-CM | POA: Diagnosis not present

## 2015-10-29 NOTE — Progress Notes (Signed)
Patient ID: Ashley Fritz, female   DOB: 11/06/1926, 82 y.o.   MRN: [id number] HPI  Complaint:  Visit Type: Patient returns to my office for continued preventative foot care services. Complaint: Patient states" my nails have grown long and thick and become painful to walk and wear shoes" Patient has been diagnosed with DM with vascular complications. She presents for preventative foot care services. No changes to ROS  Podiatric Exam: Vascular: dorsalis pedis and posterior tibial pulses are negative. Capillary return is diminished Temperature gradient is negative. Skin turgor WNL, bilateral swelling  Sensorium: Absent  Semmes Weinstein monofilament test. .  Nail Exam: Pt has thick disfigured discolored nails with subungual debris noted bilateral entire nail hallux through fifth toenails Ulcer Exam: There is  evidence of  pre-ulcerative changes right heel. Orthopedic Exam: Muscle tone and strength are WNL. No limitations in general ROM. No crepitus or effusions noted. Foot type and digits show no abnormalities. Bony prominences are unremarkable. Pes planus Skin: No Porokeratosis. No infection or ulcers  Diagnosis:  Tinea unguium, Pain in right toe, pain in left toes, DM with angiopathy  Treatment & Plan Procedures and Treatment: Consent by patient was obtained for treatment procedures. The patient understood the discussion of treatment and procedures well. All questions were answered thoroughly reviewed. Debridement of mycotic and hypertrophic toenails, 1 through 5 bilateral and clearing of subungual debris. No ulceration, no infection noted. Discussed diabetic footwear. Return Visit-Office Procedure: Patient instructed to return to the office for a follow up visit 10 weeks  for continued evaluation and treatment.   Helane GuntherGregory Shauna Bodkins DPM

## 2016-01-21 ENCOUNTER — Encounter: Payer: Self-pay | Admitting: Podiatry

## 2016-01-21 ENCOUNTER — Ambulatory Visit (INDEPENDENT_AMBULATORY_CARE_PROVIDER_SITE_OTHER): Payer: Medicare Other | Admitting: Podiatry

## 2016-01-21 DIAGNOSIS — B351 Tinea unguium: Secondary | ICD-10-CM | POA: Diagnosis not present

## 2016-01-21 DIAGNOSIS — M79606 Pain in leg, unspecified: Secondary | ICD-10-CM

## 2016-01-21 DIAGNOSIS — M79676 Pain in unspecified toe(s): Secondary | ICD-10-CM

## 2016-01-21 DIAGNOSIS — E1140 Type 2 diabetes mellitus with diabetic neuropathy, unspecified: Secondary | ICD-10-CM | POA: Diagnosis not present

## 2016-01-21 NOTE — Progress Notes (Signed)
Patient ID: Ashley CatholicGladys J Teaney, female   DOB: 06/10/1927, 82 y.o.   MRN: [id number] HPI  Complaint:  Visit Type: Patient returns to my office for continued preventative foot care services. Complaint: Patient states" my nails have grown long and thick and become painful to walk and wear shoes" Patient has been diagnosed with DM with vascular complications. She presents for preventative foot care services. No changes to ROS  Podiatric Exam: Vascular: dorsalis pedis and posterior tibial pulses are negative. Capillary return is diminished Temperature gradient is negative. Skin turgor WNL, bilateral swelling  Sensorium: Absent  Semmes Weinstein monofilament test. .  Nail Exam: Pt has thick disfigured discolored nails with subungual debris noted bilateral entire nail hallux through fifth toenails Ulcer Exam: There is  evidence of  pre-ulcerative changes right heel. Orthopedic Exam: Muscle tone and strength are WNL. No limitations in general ROM. No crepitus or effusions noted. Foot type and digits show no abnormalities. Bony prominences are unremarkable. Pes planus Skin: No Porokeratosis. No infection or ulcers  Diagnosis:  Tinea unguium, Pain in right toe, pain in left toes, DM with angiopathy  Treatment & Plan Procedures and Treatment: Consent by patient was obtained for treatment procedures. The patient understood the discussion of treatment and procedures well. All questions were answered thoroughly reviewed. Debridement of mycotic and hypertrophic toenails, 1 through 5 bilateral and clearing of subungual debris. No ulceration, no infection noted. Discussed diabetic footwear. Return Visit-Office Procedure: Patient instructed to return to the office for a follow up visit 10 weeks  for continued evaluation and treatment.   Helane GuntherGregory Brigid Vandekamp DPM

## 2016-03-31 ENCOUNTER — Ambulatory Visit (INDEPENDENT_AMBULATORY_CARE_PROVIDER_SITE_OTHER): Payer: Medicare Other | Admitting: Podiatry

## 2016-03-31 ENCOUNTER — Encounter: Payer: Self-pay | Admitting: Podiatry

## 2016-03-31 DIAGNOSIS — B351 Tinea unguium: Secondary | ICD-10-CM | POA: Diagnosis not present

## 2016-03-31 DIAGNOSIS — M79606 Pain in leg, unspecified: Secondary | ICD-10-CM

## 2016-03-31 DIAGNOSIS — E1140 Type 2 diabetes mellitus with diabetic neuropathy, unspecified: Secondary | ICD-10-CM | POA: Diagnosis not present

## 2016-03-31 DIAGNOSIS — M79676 Pain in unspecified toe(s): Secondary | ICD-10-CM | POA: Diagnosis not present

## 2016-03-31 NOTE — Progress Notes (Signed)
Patient ID: Ashley Fritz, female   DOB: 05/31/1927, 82 y.o.   MRN: [id number] HPI  Complaint:  Visit Type: Patient returns to my office for continued preventative foot care services. Complaint: Patient states" my nails have grown long and thick and become painful to walk and wear shoes" Patient has been diagnosed with DM with vascular complications. She presents for preventative foot care services. No changes to ROS  Podiatric Exam: Vascular: dorsalis pedis and posterior tibial pulses are negative. Capillary return is diminished Temperature gradient is negative. Skin turgor WNL, bilateral swelling  Sensorium: Absent  Semmes Weinstein monofilament test. .  Nail Exam: Pt has thick disfigured discolored nails with subungual debris noted bilateral entire nail hallux through fifth toenails Ulcer Exam: There is  evidence of  pre-ulcerative changes right heel. Orthopedic Exam: Muscle tone and strength are WNL. No limitations in general ROM. No crepitus or effusions noted. Foot type and digits show no abnormalities. Bony prominences are unremarkable. Pes planus Skin: No Porokeratosis. No infection or ulcers  Diagnosis:  Tinea unguium, Pain in right toe, pain in left toes, DM with angiopathy  Treatment & Plan Procedures and Treatment: Consent by patient was obtained for treatment procedures. The patient understood the discussion of treatment and procedures well. All questions were answered thoroughly reviewed. Debridement of mycotic and hypertrophic toenails, 1 through 5 bilateral and clearing of subungual debris. No ulceration, no infection noted. Discussed diabetic footwear. Return Visit-Office Procedure: Patient instructed to return to the office for a follow up visit 10 weeks  for continued evaluation and treatment. Patient was told since she has limited weight bearing and swollen feet that she may not be a candidate for diabetic shoes.   Lysandra Loughmiller DPM  

## 2016-06-23 ENCOUNTER — Ambulatory Visit (INDEPENDENT_AMBULATORY_CARE_PROVIDER_SITE_OTHER): Payer: Medicare Other | Admitting: Podiatry

## 2016-06-23 DIAGNOSIS — E1140 Type 2 diabetes mellitus with diabetic neuropathy, unspecified: Secondary | ICD-10-CM | POA: Diagnosis not present

## 2016-06-23 DIAGNOSIS — B351 Tinea unguium: Secondary | ICD-10-CM

## 2016-06-23 DIAGNOSIS — M79606 Pain in leg, unspecified: Secondary | ICD-10-CM

## 2016-06-23 NOTE — Progress Notes (Signed)
Patient ID: [name], female   DOB: 02/26/1927, 82 y.o.   MRN: [id number] HPI  Complaint:  Visit Type: Patient returns to my office for continued preventative foot care services. Complaint: Patient states" my nails have grown long and thick and become painful to walk and wear shoes" Patient has been diagnosed with DM with vascular complications. She presents for preventative foot care services. No changes to ROS  Podiatric Exam: Vascular: dorsalis pedis and posterior tibial pulses are negative. Capillary return is diminished Temperature gradient is negative. Skin turgor WNL, bilateral swelling  Sensorium: Absent  Semmes Weinstein monofilament test. .  Nail Exam: Pt has thick disfigured discolored nails with subungual debris noted bilateral entire nail hallux through fifth toenails Ulcer Exam: There is  evidence of  pre-ulcerative changes right heel. Orthopedic Exam: Muscle tone and strength are WNL. No limitations in general ROM. No crepitus or effusions noted. Foot type and digits show no abnormalities. Bony prominences are unremarkable. Pes planus Skin: No Porokeratosis. No infection or ulcers  Diagnosis:  Tinea unguium, Pain in right toe, pain in left toes, DM with angiopathy  Treatment & Plan Procedures and Treatment: Consent by patient was obtained for treatment procedures. The patient understood the discussion of treatment and procedures well. All questions were answered thoroughly reviewed. Debridement of mycotic and hypertrophic toenails, 1 through 5 bilateral and clearing of subungual debris. No ulceration, no infection noted. Discussed diabetic footwear. Return Visit-Office Procedure: Patient instructed to return to the office for a follow up visit 10 weeks  for continued evaluation and treatment. Patient was told since she has limited weight bearing and swollen feet that she may not be a candidate for diabetic shoes.   Helane GuntherGregory Rommie Dunn DPM

## 2016-09-22 ENCOUNTER — Ambulatory Visit: Payer: Medicare Other | Admitting: Podiatry

## 2016-10-10 ENCOUNTER — Ambulatory Visit: Payer: Medicare Other | Admitting: Podiatry

## 2016-10-27 ENCOUNTER — Ambulatory Visit (INDEPENDENT_AMBULATORY_CARE_PROVIDER_SITE_OTHER): Payer: Medicare Other | Admitting: Podiatry

## 2016-10-27 ENCOUNTER — Encounter: Payer: Self-pay | Admitting: Podiatry

## 2016-10-27 DIAGNOSIS — E1140 Type 2 diabetes mellitus with diabetic neuropathy, unspecified: Secondary | ICD-10-CM

## 2016-10-27 DIAGNOSIS — B351 Tinea unguium: Secondary | ICD-10-CM

## 2016-10-27 DIAGNOSIS — M79606 Pain in leg, unspecified: Secondary | ICD-10-CM

## 2016-10-27 NOTE — Progress Notes (Signed)
Patient ID: [name], female   DOB: 02/05/1927, 83 y.o.   MRN: [id number] HPI  Complaint:  Visit Type: Patient returns to my office for continued preventative foot care services. Complaint: Patient states" my nails have grown long and thick and become painful to walk and wear shoes" Patient has been diagnosed with DM with vascular complications. She presents for preventative foot care services. No changes to ROS  Podiatric Exam: Vascular: dorsalis pedis and posterior tibial pulses are negative. Unable to check due to her severe swelling and compression socks. Capillary return is diminished Temperature gradient is negative. Skin turgor WNL, bilateral swelling  Sensorium: Absent  Semmes Weinstein monofilament test. .  Nail Exam: Pt has thick disfigured discolored nails with subungual debris noted bilateral entire nail hallux through fifth toenails Ulcer Exam: There is  evidence of  pre-ulcerative changes right heel. Orthopedic Exam: Muscle tone and strength are WNL. No limitations in general ROM. No crepitus or effusions noted. Foot type and digits show no abnormalities. Bony prominences are unremarkable. Pes planus Skin: No Porokeratosis. No infection or ulcers  Diagnosis:  Tinea unguium, Pain in right toe, pain in left toes, DM with angiopathy  Treatment & Plan Procedures and Treatment: Consent by patient was obtained for treatment procedures. The patient understood the discussion of treatment and procedures well. All questions were answered thoroughly reviewed. Debridement of mycotic and hypertrophic toenails, 1 through 5 bilateral and clearing of subungual debris. No ulceration, no infection noted.  Return Visit-Office Procedure: Patient instructed to return to the office for a follow up visit 10 weeks  for continued evaluation and treatment.    Ashley Fritz DPM

## 2017-01-03 ENCOUNTER — Inpatient Hospital Stay (HOSPITAL_COMMUNITY)
Admission: EM | Admit: 2017-01-03 | Discharge: 2017-02-04 | DRG: 871 | Disposition: E | Payer: Medicare Other | Attending: Internal Medicine | Admitting: Internal Medicine

## 2017-01-03 ENCOUNTER — Emergency Department (HOSPITAL_COMMUNITY): Payer: Medicare Other

## 2017-01-03 ENCOUNTER — Encounter (HOSPITAL_COMMUNITY): Payer: Self-pay

## 2017-01-03 DIAGNOSIS — R4182 Altered mental status, unspecified: Secondary | ICD-10-CM

## 2017-01-03 DIAGNOSIS — G9341 Metabolic encephalopathy: Secondary | ICD-10-CM | POA: Diagnosis not present

## 2017-01-03 DIAGNOSIS — I4892 Unspecified atrial flutter: Secondary | ICD-10-CM | POA: Diagnosis present

## 2017-01-03 DIAGNOSIS — J690 Pneumonitis due to inhalation of food and vomit: Secondary | ICD-10-CM | POA: Diagnosis present

## 2017-01-03 DIAGNOSIS — Z833 Family history of diabetes mellitus: Secondary | ICD-10-CM | POA: Diagnosis not present

## 2017-01-03 DIAGNOSIS — T383X5A Adverse effect of insulin and oral hypoglycemic [antidiabetic] drugs, initial encounter: Secondary | ICD-10-CM | POA: Diagnosis not present

## 2017-01-03 DIAGNOSIS — Z794 Long term (current) use of insulin: Secondary | ICD-10-CM | POA: Diagnosis not present

## 2017-01-03 DIAGNOSIS — Z79899 Other long term (current) drug therapy: Secondary | ICD-10-CM

## 2017-01-03 DIAGNOSIS — A419 Sepsis, unspecified organism: Secondary | ICD-10-CM | POA: Diagnosis present

## 2017-01-03 DIAGNOSIS — J9601 Acute respiratory failure with hypoxia: Secondary | ICD-10-CM | POA: Diagnosis present

## 2017-01-03 DIAGNOSIS — I4891 Unspecified atrial fibrillation: Secondary | ICD-10-CM | POA: Diagnosis present

## 2017-01-03 DIAGNOSIS — J189 Pneumonia, unspecified organism: Secondary | ICD-10-CM | POA: Diagnosis present

## 2017-01-03 DIAGNOSIS — N390 Urinary tract infection, site not specified: Secondary | ICD-10-CM | POA: Diagnosis present

## 2017-01-03 DIAGNOSIS — Z515 Encounter for palliative care: Secondary | ICD-10-CM | POA: Diagnosis not present

## 2017-01-03 DIAGNOSIS — I110 Hypertensive heart disease with heart failure: Secondary | ICD-10-CM | POA: Diagnosis present

## 2017-01-03 DIAGNOSIS — E1169 Type 2 diabetes mellitus with other specified complication: Secondary | ICD-10-CM | POA: Diagnosis present

## 2017-01-03 DIAGNOSIS — I10 Essential (primary) hypertension: Secondary | ICD-10-CM | POA: Diagnosis not present

## 2017-01-03 DIAGNOSIS — Z7401 Bed confinement status: Secondary | ICD-10-CM | POA: Diagnosis not present

## 2017-01-03 DIAGNOSIS — Z90710 Acquired absence of both cervix and uterus: Secondary | ICD-10-CM | POA: Diagnosis not present

## 2017-01-03 DIAGNOSIS — I5022 Chronic systolic (congestive) heart failure: Secondary | ICD-10-CM | POA: Diagnosis present

## 2017-01-03 DIAGNOSIS — I350 Nonrheumatic aortic (valve) stenosis: Secondary | ICD-10-CM | POA: Diagnosis not present

## 2017-01-03 DIAGNOSIS — E11649 Type 2 diabetes mellitus with hypoglycemia without coma: Secondary | ICD-10-CM | POA: Diagnosis present

## 2017-01-03 DIAGNOSIS — Z66 Do not resuscitate: Secondary | ICD-10-CM | POA: Diagnosis present

## 2017-01-03 DIAGNOSIS — Z993 Dependence on wheelchair: Secondary | ICD-10-CM | POA: Diagnosis not present

## 2017-01-03 DIAGNOSIS — J9621 Acute and chronic respiratory failure with hypoxia: Secondary | ICD-10-CM | POA: Diagnosis not present

## 2017-01-03 DIAGNOSIS — I493 Ventricular premature depolarization: Secondary | ICD-10-CM | POA: Diagnosis present

## 2017-01-03 DIAGNOSIS — E669 Obesity, unspecified: Secondary | ICD-10-CM | POA: Diagnosis present

## 2017-01-03 DIAGNOSIS — E785 Hyperlipidemia, unspecified: Secondary | ICD-10-CM | POA: Diagnosis present

## 2017-01-03 DIAGNOSIS — Z981 Arthrodesis status: Secondary | ICD-10-CM

## 2017-01-03 DIAGNOSIS — Z8673 Personal history of transient ischemic attack (TIA), and cerebral infarction without residual deficits: Secondary | ICD-10-CM

## 2017-01-03 DIAGNOSIS — N179 Acute kidney failure, unspecified: Secondary | ICD-10-CM | POA: Diagnosis present

## 2017-01-03 DIAGNOSIS — E160 Drug-induced hypoglycemia without coma: Secondary | ICD-10-CM | POA: Diagnosis not present

## 2017-01-03 DIAGNOSIS — X58XXXA Exposure to other specified factors, initial encounter: Secondary | ICD-10-CM | POA: Diagnosis present

## 2017-01-03 DIAGNOSIS — J9811 Atelectasis: Secondary | ICD-10-CM | POA: Diagnosis not present

## 2017-01-03 DIAGNOSIS — R0602 Shortness of breath: Secondary | ICD-10-CM

## 2017-01-03 DIAGNOSIS — Z7982 Long term (current) use of aspirin: Secondary | ICD-10-CM

## 2017-01-03 DIAGNOSIS — T17890A Other foreign object in other parts of respiratory tract causing asphyxiation, initial encounter: Secondary | ICD-10-CM | POA: Diagnosis present

## 2017-01-03 DIAGNOSIS — R1310 Dysphagia, unspecified: Secondary | ICD-10-CM | POA: Diagnosis present

## 2017-01-03 DIAGNOSIS — L8990 Pressure ulcer of unspecified site, unspecified stage: Secondary | ICD-10-CM | POA: Insufficient documentation

## 2017-01-03 LAB — URINALYSIS, ROUTINE W REFLEX MICROSCOPIC
Bilirubin Urine: NEGATIVE
GLUCOSE, UA: NEGATIVE mg/dL
Ketones, ur: NEGATIVE mg/dL
NITRITE: NEGATIVE
Protein, ur: 30 mg/dL — AB
SPECIFIC GRAVITY, URINE: 1.015 (ref 1.005–1.030)
pH: 5 (ref 5.0–8.0)

## 2017-01-03 LAB — COMPREHENSIVE METABOLIC PANEL
ALBUMIN: 3.3 g/dL — AB (ref 3.5–5.0)
ALT: 13 U/L — ABNORMAL LOW (ref 14–54)
ANION GAP: 8 (ref 5–15)
AST: 20 U/L (ref 15–41)
Alkaline Phosphatase: 51 U/L (ref 38–126)
BILIRUBIN TOTAL: 0.7 mg/dL (ref 0.3–1.2)
BUN: 19 mg/dL (ref 6–20)
CHLORIDE: 93 mmol/L — AB (ref 101–111)
CO2: 34 mmol/L — AB (ref 22–32)
Calcium: 9.2 mg/dL (ref 8.9–10.3)
Creatinine, Ser: 1.09 mg/dL — ABNORMAL HIGH (ref 0.44–1.00)
GFR calc Af Amer: 51 mL/min — ABNORMAL LOW (ref >60)
GFR calc non Af Amer: 44 mL/min — ABNORMAL LOW (ref >60)
GLUCOSE: 165 mg/dL — AB (ref 65–99)
Potassium: 4.3 mmol/L (ref 3.5–5.1)
SODIUM: 135 mmol/L (ref 135–145)
Total Protein: 5.8 g/dL — ABNORMAL LOW (ref 6.5–8.1)

## 2017-01-03 LAB — CBC WITH DIFFERENTIAL/PLATELET
BASOS PCT: 0 %
Basophils Absolute: 0 10*3/uL (ref 0.0–0.1)
EOS PCT: 1 %
Eosinophils Absolute: 0.1 10*3/uL (ref 0.0–0.7)
HCT: 40 % (ref 36.0–46.0)
Hemoglobin: 12.4 g/dL (ref 12.0–15.0)
LYMPHS PCT: 35 %
Lymphs Abs: 2.1 10*3/uL (ref 0.7–4.0)
MCH: 28.5 pg (ref 26.0–34.0)
MCHC: 31 g/dL (ref 30.0–36.0)
MCV: 92 fL (ref 78.0–100.0)
Monocytes Absolute: 0.7 10*3/uL (ref 0.1–1.0)
Monocytes Relative: 11 %
Neutro Abs: 3.3 10*3/uL (ref 1.7–7.7)
Neutrophils Relative %: 53 %
PLATELETS: 172 10*3/uL (ref 150–400)
RBC: 4.35 MIL/uL (ref 3.87–5.11)
RDW: 16.7 % — ABNORMAL HIGH (ref 11.5–15.5)
WBC: 6.1 10*3/uL (ref 4.0–10.5)

## 2017-01-03 LAB — I-STAT CHEM 8, ED
BUN: 23 mg/dL — AB (ref 6–20)
CALCIUM ION: 1.17 mmol/L (ref 1.15–1.40)
Chloride: 91 mmol/L — ABNORMAL LOW (ref 101–111)
Creatinine, Ser: 1.1 mg/dL — ABNORMAL HIGH (ref 0.44–1.00)
Glucose, Bld: 157 mg/dL — ABNORMAL HIGH (ref 65–99)
HEMATOCRIT: 41 % (ref 36.0–46.0)
HEMOGLOBIN: 13.9 g/dL (ref 12.0–15.0)
Potassium: 4.3 mmol/L (ref 3.5–5.1)
Sodium: 135 mmol/L (ref 135–145)
TCO2: 37 mmol/L (ref 0–100)

## 2017-01-03 LAB — AMMONIA: Ammonia: 17 umol/L (ref 9–35)

## 2017-01-03 LAB — PROTIME-INR
INR: 1.06
Prothrombin Time: 13.9 seconds (ref 11.4–15.2)

## 2017-01-03 LAB — APTT: APTT: 27 s (ref 24–36)

## 2017-01-03 LAB — I-STAT CG4 LACTIC ACID, ED: Lactic Acid, Venous: 2.05 mmol/L (ref 0.5–1.9)

## 2017-01-03 LAB — CBG MONITORING, ED: GLUCOSE-CAPILLARY: 139 mg/dL — AB (ref 65–99)

## 2017-01-03 MED ORDER — DOCUSATE SODIUM 100 MG PO CAPS
100.0000 mg | ORAL_CAPSULE | Freq: Every day | ORAL | Status: DC
  Administered 2017-01-04 – 2017-01-05 (×2): 100 mg via ORAL
  Filled 2017-01-03 (×2): qty 1

## 2017-01-03 MED ORDER — INSULIN ASPART 100 UNIT/ML ~~LOC~~ SOLN
0.0000 [IU] | Freq: Three times a day (TID) | SUBCUTANEOUS | Status: DC
  Administered 2017-01-04: 2 [IU] via SUBCUTANEOUS

## 2017-01-03 MED ORDER — DEXTROSE 5 % IV SOLN
500.0000 mg | Freq: Once | INTRAVENOUS | Status: AC
  Administered 2017-01-03: 500 mg via INTRAVENOUS
  Filled 2017-01-03: qty 500

## 2017-01-03 MED ORDER — PIPERACILLIN-TAZOBACTAM 3.375 G IVPB
3.3750 g | Freq: Three times a day (TID) | INTRAVENOUS | Status: DC
  Administered 2017-01-04 – 2017-01-06 (×8): 3.375 g via INTRAVENOUS
  Filled 2017-01-03 (×9): qty 50

## 2017-01-03 MED ORDER — IOPAMIDOL (ISOVUE-300) INJECTION 61%
INTRAVENOUS | Status: AC
  Administered 2017-01-03: 75 mL
  Filled 2017-01-03: qty 75

## 2017-01-03 MED ORDER — SODIUM CHLORIDE 0.9 % IV SOLN
INTRAVENOUS | Status: DC
  Administered 2017-01-04: 1000 mL via INTRAVENOUS
  Administered 2017-01-05: 04:00:00 via INTRAVENOUS

## 2017-01-03 MED ORDER — SODIUM CHLORIDE 0.9 % IV BOLUS (SEPSIS)
1000.0000 mL | Freq: Once | INTRAVENOUS | Status: AC
  Administered 2017-01-03: 1000 mL via INTRAVENOUS

## 2017-01-03 MED ORDER — LAMOTRIGINE 100 MG PO TABS
200.0000 mg | ORAL_TABLET | Freq: Every day | ORAL | Status: DC
  Administered 2017-01-04: 200 mg via ORAL
  Filled 2017-01-03 (×2): qty 2

## 2017-01-03 MED ORDER — POLYETHYLENE GLYCOL 3350 17 G PO PACK
17.0000 g | PACK | Freq: Every day | ORAL | Status: DC
  Administered 2017-01-04: 17 g via ORAL
  Filled 2017-01-03: qty 1

## 2017-01-03 MED ORDER — VANCOMYCIN HCL 10 G IV SOLR
1250.0000 mg | INTRAVENOUS | Status: DC
  Administered 2017-01-04 – 2017-01-05 (×2): 1250 mg via INTRAVENOUS
  Filled 2017-01-03 (×3): qty 1250

## 2017-01-03 MED ORDER — VANCOMYCIN HCL IN DEXTROSE 1-5 GM/200ML-% IV SOLN: 1000.0000 mg | Freq: Once | INTRAVENOUS | Status: DC

## 2017-01-03 MED ORDER — INSULIN GLARGINE 100 UNIT/ML ~~LOC~~ SOLN
36.0000 [IU] | Freq: Every day | SUBCUTANEOUS | Status: DC
  Administered 2017-01-04 (×2): 36 [IU] via SUBCUTANEOUS
  Filled 2017-01-03 (×2): qty 0.36

## 2017-01-03 MED ORDER — PANTOPRAZOLE SODIUM 40 MG PO TBEC
40.0000 mg | DELAYED_RELEASE_TABLET | Freq: Every day | ORAL | Status: DC
  Administered 2017-01-04 – 2017-01-05 (×2): 40 mg via ORAL
  Filled 2017-01-03 (×2): qty 1

## 2017-01-03 MED ORDER — CHLORHEXIDINE GLUCONATE 0.12 % MT SOLN
15.0000 mL | Freq: Two times a day (BID) | OROMUCOSAL | Status: DC
  Administered 2017-01-04 – 2017-01-05 (×4): 15 mL via OROMUCOSAL
  Filled 2017-01-03 (×4): qty 15

## 2017-01-03 MED ORDER — IPRATROPIUM-ALBUTEROL 0.5-2.5 (3) MG/3ML IN SOLN
3.0000 mL | Freq: Four times a day (QID) | RESPIRATORY_TRACT | Status: DC
  Administered 2017-01-04 (×3): 3 mL via RESPIRATORY_TRACT
  Filled 2017-01-03 (×3): qty 3

## 2017-01-03 MED ORDER — DEXTROSE 5 % IV SOLN
1.0000 g | Freq: Once | INTRAVENOUS | Status: AC
  Administered 2017-01-03: 1 g via INTRAVENOUS
  Filled 2017-01-03: qty 10

## 2017-01-03 MED ORDER — ATORVASTATIN CALCIUM 20 MG PO TABS
20.0000 mg | ORAL_TABLET | Freq: Every day | ORAL | Status: DC
  Administered 2017-01-04: 20 mg via ORAL
  Filled 2017-01-03: qty 1

## 2017-01-03 MED ORDER — LORATADINE 10 MG PO TABS
10.0000 mg | ORAL_TABLET | Freq: Every day | ORAL | Status: DC
  Administered 2017-01-04 – 2017-01-05 (×2): 10 mg via ORAL
  Filled 2017-01-03 (×2): qty 1

## 2017-01-03 MED ORDER — DEXTROSE 5 % IV SOLN: 2.0000 g | Freq: Once | INTRAVENOUS | Status: DC

## 2017-01-03 MED ORDER — LATANOPROST 0.005 % OP SOLN
1.0000 [drp] | Freq: Every day | OPHTHALMIC | Status: DC
  Administered 2017-01-04 – 2017-01-05 (×3): 1 [drp] via OPHTHALMIC
  Filled 2017-01-03: qty 2.5

## 2017-01-03 MED ORDER — ENOXAPARIN SODIUM 40 MG/0.4ML ~~LOC~~ SOLN
40.0000 mg | Freq: Every day | SUBCUTANEOUS | Status: DC
  Administered 2017-01-04 – 2017-01-05 (×3): 40 mg via SUBCUTANEOUS
  Filled 2017-01-03 (×3): qty 0.4

## 2017-01-03 MED ORDER — ASPIRIN 81 MG PO CHEW
81.0000 mg | CHEWABLE_TABLET | Freq: Every day | ORAL | Status: DC
  Administered 2017-01-04 – 2017-01-05 (×2): 81 mg via ORAL
  Filled 2017-01-03 (×2): qty 1

## 2017-01-03 MED ORDER — GABAPENTIN 300 MG PO CAPS
300.0000 mg | ORAL_CAPSULE | Freq: Three times a day (TID) | ORAL | Status: DC
  Administered 2017-01-04 – 2017-01-05 (×4): 300 mg via ORAL
  Filled 2017-01-03 (×4): qty 1

## 2017-01-03 NOTE — Progress Notes (Signed)
Page Dr Ophelia CharterYates regarding pts diet. CT shows possible aspiration pneumonia. I was inquiring about making her NPO and have speech consult. Awaiting for orders.

## 2017-01-03 NOTE — ED Provider Notes (Cosign Needed)
Patient seen and evaluated. Discussed with Dr. Armond HangWoodrum. Patient has improved her mental status after arrival here with oxygenation. CT of the chest shows cardiac enlargement and infiltrate. Urine appears infected. Has had cultures and antibiotics. Mentating better here. VS remain stable. I agree with treatment plan as above including admission for further care and further antibiotics.   Ashley Fritz, Ashley Hinderer, MD May 30, 2017 2135    Ashley Fritz, [name], MD 01/04/17 [phone]

## 2017-01-03 NOTE — H&P (Signed)
History and Physical    CHERISH RUNDE ZOX:[id number] DOB: 1926-12-26 DOA: Jan 05, 2017  PCP: [facility] doctor Consultants:  None Patient coming from: [facility]; [facility]: friend, [phone]  Chief Complaint: hypoxia  HPI: EMERI ESTILL is a 83 y.o. female with medical history significant of HTN, HLD, DM, and remote CVA presenting after being sent from her SNF with hypoxia into the 60s.  She is unable to provide history.  Her friend reports that last week, she had a cough and rhinorrhea.  They did a CXR which was negative.  Gave her a z-pack and prednisone.  The nurse said she was crying yesterday.  Today, her O2 level was low and so they sent her to the ER.  +cough, nonproductive.  But lots of mucous coming out of her nose.  No fever.  Has not been talking as well the last few days.   ED Course: Hypoxia into the 60s, improved with 4L [place] O2.  GCS 14.  Improved mentation with O2.  CT with PNA, possible UTI.  Review of Systems: Unable to perform  Ambulatory Status:  Bed bound  Past Medical History:  Diagnosis Date  . Acute, but ill-defined, cerebrovascular disease 01/16/2012  . ANEMIA-NOS   . AORTIC STENOSIS/ INSUFFICIENCY, NON-RHEUMATIC   . Cataracts, bilateral   . CVA 07/2008   no residual deficits  . DIABETES MELLITUS, TYPE II   . GAIT DISTURBANCE    multifactorial: cervical myelopathy, DDD lumbar spine and knee OA  . HYPERLIPIDEMIA   . HYPERTENSION   . LOW BACK PAIN   . Muscle weakness (generalized) 11/30/2010  . MYELOPATHY, CERVICAL SPINE    s/p ACDF 10/2010 C3-4, residual BLE weakness, WC bound since 08/2010 but improved use BUE  . OSTEOARTHRITIS    R>L knee  . PRESSURE ULCER LOWER BACK 08/26/2010    Past Surgical History:  Procedure Laterality Date  . ABDOMINAL HYSTERECTOMY    . ANTERIOR CERVICAL DECOMP/DISCECTOMY FUSION  09/2010   C3-4 for cord compression and HNP with myelopathy  . APPENDECTOMY    . OOPHORECTOMY      Social History   Social History  .  Marital status: Divorced    Spouse name: N/A  . Number of children: N/A  . Years of education: N/A   Occupational History  . Not on file.   Social History Main Topics  . Smoking status: Never Smoker  . Smokeless tobacco: Never Used     Comment: since 09/2010 neck surgery, lived at [facility], then with friend, then [facility] SNF  . Alcohol use No  . Drug use: No  . Sexual activity: Not on file   Other Topics Concern  . Not on file   Social History Narrative  . No narrative on file    No Known Allergies  Family History  Problem Relation Age of Onset  . Diabetes Mother   . Alcohol abuse Father     Prior to Admission medications   Medication Sig Start Date End Date Taking? Authorizing Provider  acetaminophen (TYLENOL) 325 MG tablet Take 650 mg by mouth 2 (two) times daily.    [provider]  amLODipine (NORVASC) 5 MG tablet Take 5 mg by mouth daily.      [provider]  aspirin 81 MG tablet Take 81 mg by mouth daily.      [provider]  atorvastatin (LIPITOR) 20 MG tablet Take 20 mg by mouth at bedtime.     [provider]  calcium citrate-vitamin D (CITRACAL+D) 315-200 MG-UNIT per tablet Take 2 tablets by mouth 2 (two) times daily.     [provider]  Capsaicin (CAPZASIN-HP) 0.1 % CREA Apply topically.    [provider]  chlorhexidine (PERIDEX) 0.12 % solution Use as directed 15 mLs in the mouth or throat 2 (two) times daily.    [provider]  Cholecalciferol 2000 units TABS Take by mouth daily at 2 PM.    [provider]  Docusate Sodium (STOOL SOFTENER) 100 MG capsule Take 100 mg by mouth daily.     [provider]  ergocalciferol (VITAMIN D2) 50000 UNITS capsule Take 50,000 Units by mouth once a week.    [provider]  furosemide (LASIX) 20 MG tablet Take 20 mg by mouth daily.    [provider]  gabapentin (NEURONTIN) 300 MG capsule Take 1 capsule (300 mg total)  by mouth 3 (three) times daily. 11/13/11   Newt LukesLeschber, Valerie A, MD  insulin aspart (NOVOLOG) 100 UNIT/ML injection Inject 5 Units into the skin 3 (three) times daily before meals.    [provider]  insulin glargine (LANTUS) 100 UNIT/ML injection Inject 30 Units into the skin at bedtime.     [provider]  latanoprost (XALATAN) 0.005 % ophthalmic solution Place 1 drop into both eyes at bedtime.    [provider]  lidocaine (LIDODERM) 5 % Place 3 patches onto the skin every 12 (twelve) hours. Remove & Discard patch within 12 hours or as directed by MD. Apply to right anterior shoulder in the AM and apply 1/2 patch to left knee in the am and off in the evening.    [provider]  losartan (COZAAR) 100 MG tablet Take 100 mg by mouth daily.    [provider]  Multiple Vitamins-Minerals (CERTA-VITE PO) Take by mouth daily.    [provider]  nitroGLYCERIN (NITROSTAT) 0.4 MG SL tablet Place 0.4 mg under the tongue every 5 (five) minutes as needed for chest pain. May repeat for 2 doses as needed for chest pain.    [provider]  omeprazole (PRILOSEC) 40 MG capsule Take 40 mg by mouth daily. For reflux    [provider]  polyethylene glycol (MIRALAX / GLYCOLAX) packet Take 17 g by mouth daily.    [provider]  Skin Protectants, Misc. (EUCERIN) cream Apply 1 application topically as needed for dry skin.    [provider]    Physical Exam: Vitals:   12/13/2016 2216 12/07/2016 2242 12/15/2016 2300 12/27/2016 2332  BP: (!) 93/55   (!) 103/52  Pulse: 66 (!) 57  (!) 59  Resp: 17   20  Temp:      TempSrc:      SpO2: (!) 89% 98%  92%  Weight:   96.2 kg (212 lb)      General:  Appears calm and comfortable and is NAD; some attempts at engagement and conversation but generally sleeping throughout evaluation Eyes:  PERRL, EOMI, normal lids, iris ENT:  grossly normal hearing, lips & tongue, mmm Neck:  no LAD,  masses or thyromegaly Cardiovascular:  Irregularly irregular but rate controlled, no m/r/g. No LE edema.  Respiratory:  CTA bilaterally, no w/r/r, but with diminished BS. Normal respiratory effort. Abdomen:  soft, mild diffuse abdominal TTP, nd, NABS Skin:  no rash or induration seen on limited exam Musculoskeletal:  grossly normal tone BUE/BLE, good ROM, no bony abnormality Psychiatric:  Somnolent but awakens to voice  and attempts to follow commands.  Speaks minimally but answers a few questions appropriately. Neurologic:  Unable to perform  Labs on Admission: I have personally reviewed following labs and imaging studies  CBC:  Recent Labs Lab 2017-01-27 1905 27-Jan-2017 1928  WBC 6.1  --   NEUTROABS 3.3  --   HGB 12.4 13.9  HCT 40.0 41.0  MCV 92.0  --   PLT 172  --    Basic Metabolic Panel:  Recent Labs Lab 2017/01/27 1905 2017/01/27 1928  NA 135 135  K 4.3 4.3  CL 93* 91*  CO2 34*  --   GLUCOSE 165* 157*  BUN 19 23*  CREATININE 1.09* 1.10*  CALCIUM 9.2  --    GFR: CrCl cannot be calculated (Unknown ideal weight.). Liver Function Tests:  Recent Labs Lab January 27, 2017 1905  AST 20  ALT 13*  ALKPHOS 51  BILITOT 0.7  PROT 5.8*  ALBUMIN 3.3*   No results for input(s): LIPASE, AMYLASE in the last 168 hours.  Recent Labs Lab January 27, 2017 1905  AMMONIA 17   Coagulation Profile:  Recent Labs Lab 27-Jan-2017 1905  INR 1.06   Cardiac Enzymes: No results for input(s): CKTOTAL, CKMB, CKMBINDEX, TROPONINI in the last 168 hours. BNP (last 3 results) No results for input(s): PROBNP in the last 8760 hours. HbA1C: No results for input(s): HGBA1C in the last 72 hours. CBG:  Recent Labs Lab 2017/01/27 1917  GLUCAP 139*   Lipid Profile: No results for input(s): CHOL, HDL, LDLCALC, TRIG, CHOLHDL, LDLDIRECT in the last 72 hours. Thyroid Function Tests: No results for input(s): TSH, T4TOTAL, FREET4, T3FREE, THYROIDAB in the last 72 hours. Anemia Panel: No results for  input(s): VITAMINB12, FOLATE, FERRITIN, TIBC, IRON, RETICCTPCT in the last 72 hours. Urine analysis:    Component Value Date/Time   COLORURINE AMBER (A) 01-27-17 1900   APPEARANCEUR TURBID (A) January 27, 2017 1900   LABSPEC 1.015 01-27-17 1900   PHURINE 5.0 2017-01-27 1900   GLUCOSEU NEGATIVE 2017-01-27 1900   HGBUR SMALL (A) 2017/01/27 1900   BILIRUBINUR NEGATIVE 2017-01-27 1900   KETONESUR NEGATIVE Jan 27, 2017 1900   PROTEINUR 30 (A) 01/27/17 1900   UROBILINOGEN 1.0 09/23/2010 1411   NITRITE NEGATIVE 2017-01-27 1900   LEUKOCYTESUR MODERATE (A) 01/27/17 1900    Creatinine Clearance: CrCl cannot be calculated (Unknown ideal weight.).  Sepsis Labs: @LABRCNTIP (procalcitonin:4,lacticidven:4) )No results found for this or any previous visit (from the past 240 hour(s)).   Radiological Exams on Admission: Dg Chest 2 View  Result Date: 01-27-17 CLINICAL DATA:  Altered mental status. EXAM: CHEST  2 VIEW COMPARISON:  02/03/2015 FINDINGS: There is marked cardiac enlargement. Aortic atherosclerosis noted. Diminished lung volumes. This is particularly notable involving the lateral chest radiograph. There is no pleural effusion or edema. IMPRESSION: 1. Cardiac enlargement and low lung volumes. 2.  Aortic Atherosclerosis (ICD10-I70.0). Electronically Signed   By: Signa Kell M.D.   On: January 27, 2017 18:41   Ct Head Wo Contrast  Result Date: 01/27/2017 CLINICAL DATA:  Altered mentation EXAM: CT HEAD WITHOUT CONTRAST TECHNIQUE: Contiguous axial images were obtained from the base of the skull through the vertex without intravenous contrast. COMPARISON:  04/17/2010 head CT, 08/16/2010 MRI FINDINGS: BRAIN: There is sulcal and ventricular prominence consistent with superficial and central atrophy. No intraparenchymal hemorrhage, mass effect nor midline shift. Periventricular and subcortical white matter hypodensities consistent with chronic small vessel ischemic disease are identified. No acute large  vascular territory infarcts. No abnormal extra-axial fluid collections. Basal cisterns are not effaced and midline.  VASCULAR: Moderate calcific atherosclerosis of the carotid siphons. SKULL: No skull fracture. No significant scalp soft tissue swelling. SINUSES/ORBITS: The mastoid air-cells are clear. The included paranasal sinuses are well-aerated.The included ocular globes and orbital contents are non-suspicious. Prior right lens surgical change. OTHER: Partial opacification of the mastoid air cells bilaterally appear IMPRESSION: 1. Atrophy with chronic small vessel ischemia. 2. No acute intracranial abnormality. 3. Small bilateral chronic mastoid effusions. Electronically Signed   By: Tollie Eth M.D.   On: 12/06/2016 19:03   Ct Chest W Contrast  Result Date: 12/10/2016 CLINICAL DATA:  83 y/o  F; altered mental status, low oxygen level. EXAM: CT CHEST WITH CONTRAST TECHNIQUE: Multidetector CT imaging of the chest was performed during intravenous contrast administration. CONTRAST:  75mL ISOVUE-300 IOPAMIDOL (ISOVUE-300) INJECTION 61% COMPARISON:  02/03/2015 CT chest.  12/29/2016 chest radiograph. FINDINGS: Cardiovascular: Moderate to severe cardiomegaly. Severe coronary artery calcification. Mitral annular and aortic valvular calcification. No pericardial effusion. Normal caliber thoracic aorta with mild calcific atherosclerosis. Mediastinum/Nodes: Normal thyroid gland. No mediastinal adenopathy. There is apparent debris in the central airways, fluid levels in lobar and segmental airways, and mucous plugging in the lung bases. Lungs/Pleura: Consolidation within the left upper lobe along the major fissure extending into the lingula and patchy opacification in dependent lower lobes bilaterally. Upper Abdomen: Subcentimeter cyst within the left kidney interpolar region. Musculoskeletal: Extensive degenerative changes of the thoracic spine. No acute osseous abnormality is identified. IMPRESSION: 1. Debris within  the central airways extending into the lung bases, distribution suggest aspiration. 2. Dependent consolidation within the left upper lobe along the major fissure and in dependent lower lobes may represent atelectasis or pneumonia. 3. Moderate to severe cardiomegaly. Coronary and aortic calcific atherosclerosis. 4. Aortic valvular and mitral annular calcification, correlate for valvular dysfunction. Electronically Signed   By: Mitzi Hansen M.D.   On: 12/31/2016 20:47    EKG: Independently reviewed.  Afib with rate 65; IVCD, low voltage, nonspecific ST changes with no evidence of acute ischemia  Assessment/Plan Principal Problem:   HCAP (healthcare-associated pneumonia) Active Problems:   Diabetes mellitus type 2 in obese [facility])   Essential hypertension   UTI   Sepsis (HCC)   Atrial fibrillation and flutter (HCC)   AKI (acute kidney injury) (HCC)   Sepsis due to HCAP and UTI -Hypoxia and borderline tachypnea with elevated lactate to 2.05 (repeat 1.8) and borderline hypotension -While awaiting blood cultures, this appears to be a preseptic condition. -Sepsis protocol initiated -Suspect pneumonia as source, although she also appears to have UTI -UA: small Hgb, 30 protein, moderate LE, many bacteria, TNTC RBC and WBC -Given cough, decreased oxygen saturation, and infiltrates on chest x-ray, most likely pneumonia.  - CT is concerning for aspiration and currently she does appear to have AMS sufficient to raise concern for this; will keep patient NPO and order speech therapy swallow evaluation for the AM. -CURB-65 score is 2 - will admit the patient to Med Surg, the patient has a 9.2% risk of death. -The patient has the following criteria for MDR (multi-drug resistance): SNF placement. -The patient will need treatment for HCAP due to MDR risk factor as above; will treat with Zosyn and Vancomycin (to effectively cover aspiration as well as UTI). -NS @ 75cc/hr -Fever control -Repeat CBC  in am -Sputum cultures -Blood cultures -Strep pneumo testing -Will order procalcitonin levels.  >0.5 indicates infection and >>0.5 indicates more serious disease.  As the procalcitonin level normalizes, it will be reasonable to consider de-escalation of  antibiotic coverage. -Blood and urine cultures pending -Will admit with telemetry and continue to monitor -Lactate 2.05, repeat 1.8 *Note: the nurse called to report a low (94) rectal temperature for the patient.  Will place warming blankets and monitor.  If not improving within the hour, will plan to transfer to SDU with Bair hugger.  DM -Glucose 165, 157 -Continue Lantus -Cover with SSI  AKI -BUN 19, creatinine 1.09, GFR 44; prior 17/0.60 in 6/16 -Will hydrate and follow, likely prerenal in the setting of decreased PO intake and sepsis. -Will hold Cozaar.  HTN -Hold Norvasc and Cozaar due to borderline hypotension.  Afib -Patient presenting with possible new-onset afib - although there is an EKG from 2012 that also appears to show this.  -Since the afib onset is unknown, will focus on rate control and searching for the underlying cause at this time (possibly sepsis). -She is rate controlled at this time.  -Will continue daily ASA 81 mg PO.   -Consider consulting cardiology in the AM. -Patient is not on any anti-coagulatants at home. Patient has high risk for fall, therefore is not a good candidate for anticoagulation.     DVT prophylaxis: Lovenox  Code Status:  Full - confirmed with family Family Communication: Friend present throughout evaluation Disposition Plan:  Back to SNF once clinically improved Consults called: None Admission status: Admit - It is my clinical opinion that admission to INPATIENT is reasonable and necessary because this patient will require at least 2 midnights in the hospital to treat this condition based on the medical complexity of the problems presented.  Given the aforementioned information, the  predictability of an adverse outcome is felt to be significant.    Jonah Blue MD [facility]  If 7PM-7AM, please contact night-coverage [email or web address] Password [email or web address]  01/04/2017, 12:59 AM

## 2017-01-03 NOTE — Progress Notes (Signed)
Received report  [place] (?) on patient.

## 2017-01-03 NOTE — Progress Notes (Signed)
Pharmacy Antibiotic Note  Ashley Fritz is a 83 y.o. female admitted on 12/14/2016 with pneumonia, possible aspiration.  Pharmacy has been consulted for Vancomycin and Zosyn dosing. Est CrCl 45 ml/min.  Plan: Zosyn 3.375gm IV q8h - each dose over 4 hours Vancomycin 1250mg  IV q24h Will f/u micro data, renal function, and pt's clinical condition Vanc trough prn   Weight: 212 lb (96.2 kg)  Temp (24hrs), Avg:97.6 F (36.4 C), Min:97.4 F (36.3 C), Max:97.8 F (36.6 C)   Recent Labs Lab 12/13/2016 1905 12/09/2016 1928  WBC 6.1  --   CREATININE 1.09* 1.10*  LATICACIDVEN  --  2.05*    CrCl cannot be calculated (Unknown ideal weight.).    No Known Allergies  Antimicrobials this admission: 5/30 Rocephin x 1 5/30 Azith x 1 5/31 Vanc >>  5/31 Zosyn >>   Dose adjustments this admission: n/a  Microbiology results: 5/30 BCx x2:   UCx:    Sputum:    Thank you for allowing pharmacy to be a part of this patient's care.  Christoper Fabianaron Randale Carvalho, PharmD, BCPS Clinical pharmacist, pager [phone] 12/24/2016 11:38 PM

## 2017-01-03 NOTE — ED Triage Notes (Signed)
[facility]- pt coming from [facility] for altered mental status. Unknown LSN. Pt initially GCS of 13. EMS found pt to have SPO2 of 70%. Pt alert on arrival. Pt GCS of 15 at baseline.

## 2017-01-03 NOTE — ED Provider Notes (Signed)
[facility]-EMERGENCY DEPT Provider Note   CSN: [id number] Arrival date & time: 12/09/2016  1702     History   Chief Complaint Chief Complaint  Patient presents with  . Altered Mental Status    HPI Ashley Fritz is a 83 y.o. female.  The history is provided by the patient and the EMS personnel.  Altered Mental Status   This is a new problem. Episode onset: unclear but got worse today. The problem has been resolved. Associated symptoms include confusion and weakness. Associated symptoms comments: Normally gcs 15 but found to be altered and confused. Found to be hypoxic as low as 60s with EMS upon arrival corrected with Humphreys. Risk factors: nursing home resident.    Past Medical History:  Diagnosis Date  . Acute, but ill-defined, cerebrovascular disease 01/16/2012  . ANEMIA-NOS   . AORTIC STENOSIS/ INSUFFICIENCY, NON-RHEUMATIC   . Cataracts, bilateral   . CVA 07/2008   no residual deficits  . DIABETES MELLITUS, TYPE II   . GAIT DISTURBANCE    multifactorial: cervical myelopathy, DDD lumbar spine and knee OA  . HYPERLIPIDEMIA   . HYPERTENSION   . LOW BACK PAIN   . Muscle weakness (generalized) 11/30/2010  . MYELOPATHY, CERVICAL SPINE    s/p ACDF 10/2010 C3-4, residual BLE weakness, WC bound since 08/2010 but improved use BUE  . OSTEOARTHRITIS    R>L knee  . PRESSURE ULCER LOWER BACK 08/26/2010    Patient Active Problem List   Diagnosis Date Noted  . MYELOPATHY, CERVICAL SPINE 08/26/2010  . PRESSURE ULCER LOWER BACK 08/26/2010  . UTI 08/26/2010  . CVA 06/10/2010  . GAIT DISTURBANCE 06/10/2010  . HYPOALBUMINEMIA 04/20/2010  . ANEMIA-NOS 04/20/2010  . LOW BACK PAIN 04/20/2010  . EDEMA 04/20/2010  . AORTIC STENOSIS/ INSUFFICIENCY, NON-RHEUMATIC 06/30/2009  . DIABETES MELLITUS, TYPE II 02/06/2007  . HYPERLIPIDEMIA 02/06/2007  . HYPERTENSION 02/06/2007  . OSTEOARTHRITIS 02/06/2007    Past Surgical History:  Procedure Laterality Date  . ABDOMINAL HYSTERECTOMY    . ANTERIOR  CERVICAL DECOMP/DISCECTOMY FUSION  09/2010   C3-4 for cord compression and HNP with myelopathy  . APPENDECTOMY    . OOPHORECTOMY      OB History    No data available       Home Medications    Prior to Admission medications   Medication Sig Start Date End Date Taking? Authorizing Provider  acetaminophen (TYLENOL) 325 MG tablet Take 650 mg by mouth 2 (two) times daily.    [provider]  amLODipine (NORVASC) 5 MG tablet Take 5 mg by mouth daily.      [provider]  aspirin 81 MG tablet Take 81 mg by mouth daily.      [provider]  atorvastatin (LIPITOR) 20 MG tablet Take 20 mg by mouth at bedtime.     [provider]  calcium citrate-vitamin D (CITRACAL+D) 315-200 MG-UNIT per tablet Take 2 tablets by mouth 2 (two) times daily.     [provider]  Capsaicin (CAPZASIN-HP) 0.1 % CREA Apply topically.    [provider]  chlorhexidine (PERIDEX) 0.12 % solution Use as directed 15 mLs in the mouth or throat 2 (two) times daily.    [provider]  Cholecalciferol 2000 units TABS Take by mouth daily at 2 PM.    [provider]  Docusate Sodium (STOOL SOFTENER) 100 MG capsule Take 100 mg by mouth daily.     [provider]  ergocalciferol (VITAMIN D2) 50000 UNITS capsule Take  50,000 Units by mouth once a week.    [provider]  furosemide (LASIX) 20 MG tablet Take 20 mg by mouth daily.    [provider]  gabapentin (NEURONTIN) 300 MG capsule Take 1 capsule (300 mg total) by mouth 3 (three) times daily. 11/13/11   Newt Lukes, MD  insulin aspart (NOVOLOG) 100 UNIT/ML injection Inject 5 Units into the skin 3 (three) times daily before meals.    [provider]  insulin glargine (LANTUS) 100 UNIT/ML injection Inject 30 Units into the skin at bedtime.     [provider]  latanoprost (XALATAN) 0.005 % ophthalmic solution Place 1 drop into both eyes at bedtime.     [provider]  lidocaine (LIDODERM) 5 % Place 3 patches onto the skin every 12 (twelve) hours. Remove & Discard patch within 12 hours or as directed by MD. Apply to right anterior shoulder in the AM and apply 1/2 patch to left knee in the am and off in the evening.    [provider]  losartan (COZAAR) 100 MG tablet Take 100 mg by mouth daily.    [provider]  Multiple Vitamins-Minerals (CERTA-VITE PO) Take by mouth daily.    [provider]  nitroGLYCERIN (NITROSTAT) 0.4 MG SL tablet Place 0.4 mg under the tongue every 5 (five) minutes as needed for chest pain. May repeat for 2 doses as needed for chest pain.    [provider]  omeprazole (PRILOSEC) 40 MG capsule Take 40 mg by mouth daily. For reflux    [provider]  polyethylene glycol (MIRALAX / GLYCOLAX) packet Take 17 g by mouth daily.    [provider]  Skin Protectants, Misc. (EUCERIN) cream Apply 1 application topically as needed for dry skin.    [provider]    Family History Family History  Problem Relation Age of Onset  . Diabetes Mother   . Alcohol abuse Father     Social History Social History  Substance Use Topics  . Smoking status: Never Smoker  . Smokeless tobacco: Never Used     Comment: since 09/2010 neck surgery, lived at [facility], then with friend, then [facility]  . Alcohol use No     Allergies   Patient has no known allergies.   Review of Systems Review of Systems  Unable to perform ROS: Mental status change  Neurological: Positive for weakness.  Psychiatric/Behavioral: Positive for confusion.     Physical Exam Updated Vital Signs There were no vitals taken for this visit. ED Triage Vitals [12/31/2016 1726]  Enc Vitals Group     BP (!) 167/131     Pulse Rate 90     Resp (!) 21     Temp 97.8 F (36.6 C)     Temp Source Oral     SpO2 95 %     Weight      Height      Head Circumference      Peak Flow       Pain Score      Pain Loc      Pain Edu?      Excl. in GC?     Physical Exam  Constitutional: She appears well-developed.  On 4 L   HENT:  Head: Normocephalic and atraumatic.  Dry mucous membranes  Eyes: Conjunctivae are normal.  Neck: Normal range of motion. Neck supple.  Cardiovascular:  No murmur heard. Tachy and irreg  Pulmonary/Chest: Effort normal. Tachypnea  noted. No respiratory distress. She has decreased breath sounds. She has rhonchi.  Abdominal: Soft. There is no tenderness. There is no guarding.  Musculoskeletal: She exhibits no edema or tenderness.  Neurological: She is alert. She has normal strength. She is disoriented. GCS eye subscore is 4. GCS verbal subscore is 4. GCS motor subscore is 6.  Skin: Skin is warm and dry.  Psychiatric: She has a normal mood and affect.  Nursing note and vitals reviewed.    ED Treatments / Results  Labs (all labs ordered are listed, but only abnormal results are displayed) Labs Reviewed - No data to display  EKG  EKG Interpretation None       Radiology No results found.  Procedures Procedures (including critical care time)  Medications Ordered in ED Medications - No data to display   Initial Impression / Assessment and Plan / ED Course  I have reviewed the triage vital signs and the nursing notes.  Pertinent labs & imaging results that were available during my care of the patient were reviewed by me and considered in my medical decision making (see chart for details).     Patient is a 83 year old female with above past medical history presents from facility for concern of altered mental status and found to be hypoxic with O2 sats in the 60s upon EMSs arrival which improved with 4 L nasal cannula. No recent reports of fevers, cough, shortness of breath. Upon arrival she is hemodynamically stable on 4 L nasal cannula with a GCS of 14. She does have decreased breath sounds and crackles throughout but no fever here.  Further history and exam as above. Found to have possible UTI and LLL pna. No fever here. Given possible effusion on cxr, ct obtained revealing consolidation in left lower and upper lobes. At this time pt has no obvious MDR risk factors according to IDSA guidelines 2016 of receiving IV atx with 90 days, renal replacement therapy, septic shock, or recent hospitalization thus she will be given CAP coverage as well as coverage for UTI. Blood cultures, urine cultures, and IVF given. She will be admitted for further management and evaluation.   Final Clinical Impressions(s) / ED Diagnoses   Final diagnoses:  None    New Prescriptions New Prescriptions   No medications on file     Marijean Niemann, MD 01/04/17 1238    Rolland Porter, MD 01/04/17 1520    Rolland Porter, MD 01/21/17 [phone]

## 2017-01-04 ENCOUNTER — Inpatient Hospital Stay (HOSPITAL_COMMUNITY): Payer: Medicare Other

## 2017-01-04 DIAGNOSIS — I4892 Unspecified atrial flutter: Secondary | ICD-10-CM

## 2017-01-04 DIAGNOSIS — I4891 Unspecified atrial fibrillation: Secondary | ICD-10-CM | POA: Diagnosis present

## 2017-01-04 DIAGNOSIS — I10 Essential (primary) hypertension: Secondary | ICD-10-CM

## 2017-01-04 DIAGNOSIS — A419 Sepsis, unspecified organism: Secondary | ICD-10-CM | POA: Diagnosis present

## 2017-01-04 DIAGNOSIS — N179 Acute kidney failure, unspecified: Secondary | ICD-10-CM | POA: Diagnosis present

## 2017-01-04 LAB — CBC WITH DIFFERENTIAL/PLATELET
Basophils Absolute: 0 10*3/uL (ref 0.0–0.1)
Basophils Relative: 0 %
EOS PCT: 2 %
Eosinophils Absolute: 0.1 10*3/uL (ref 0.0–0.7)
HEMATOCRIT: 42.6 % (ref 36.0–46.0)
Hemoglobin: 13.1 g/dL (ref 12.0–15.0)
LYMPHS PCT: 35 %
Lymphs Abs: 1.8 10*3/uL (ref 0.7–4.0)
MCH: 28.4 pg (ref 26.0–34.0)
MCHC: 30.8 g/dL (ref 30.0–36.0)
MCV: 92.2 fL (ref 78.0–100.0)
MONO ABS: 0.6 10*3/uL (ref 0.1–1.0)
MONOS PCT: 11 %
NEUTROS ABS: 2.6 10*3/uL (ref 1.7–7.7)
Neutrophils Relative %: 52 %
Platelets: 178 10*3/uL (ref 150–400)
RBC: 4.62 MIL/uL (ref 3.87–5.11)
RDW: 17 % — AB (ref 11.5–15.5)
WBC: 5.1 10*3/uL (ref 4.0–10.5)

## 2017-01-04 LAB — BASIC METABOLIC PANEL
ANION GAP: 10 (ref 5–15)
BUN: 15 mg/dL (ref 6–20)
CALCIUM: 9 mg/dL (ref 8.9–10.3)
CO2: 30 mmol/L (ref 22–32)
Chloride: 97 mmol/L — ABNORMAL LOW (ref 101–111)
Creatinine, Ser: 0.8 mg/dL (ref 0.44–1.00)
GFR calc Af Amer: >60 mL/min (ref >60)
GFR calc non Af Amer: >60 mL/min (ref >60)
GLUCOSE: 162 mg/dL — AB (ref 65–99)
Potassium: 4.1 mmol/L (ref 3.5–5.1)
Sodium: 137 mmol/L (ref 135–145)

## 2017-01-04 LAB — TSH: TSH: 0.429 u[IU]/mL (ref 0.350–4.500)

## 2017-01-04 LAB — CORTISOL: Cortisol, Plasma: 10.5 ug/dL

## 2017-01-04 LAB — LACTIC ACID, PLASMA
LACTIC ACID, VENOUS: 1.8 mmol/L (ref 0.5–1.9)
Lactic Acid, Venous: 1.8 mmol/L (ref 0.5–1.9)

## 2017-01-04 LAB — GLUCOSE, CAPILLARY
GLUCOSE-CAPILLARY: 148 mg/dL — AB (ref 65–99)
Glucose-Capillary: 161 mg/dL — ABNORMAL HIGH (ref 65–99)
Glucose-Capillary: 115 mg/dL — ABNORMAL HIGH (ref 65–99)
Glucose-Capillary: 94 mg/dL (ref 65–99)

## 2017-01-04 LAB — PROCALCITONIN

## 2017-01-04 LAB — MRSA PCR SCREENING: MRSA by PCR: NEGATIVE

## 2017-01-04 LAB — STREP PNEUMONIAE URINARY ANTIGEN: Strep Pneumo Urinary Antigen: NEGATIVE

## 2017-01-04 MED ORDER — MUSCLE RUB 10-15 % EX CREA
TOPICAL_CREAM | CUTANEOUS | Status: DC | PRN
  Administered 2017-01-04: 13:00:00 via TOPICAL
  Filled 2017-01-04: qty 85

## 2017-01-04 MED ORDER — ORAL CARE MOUTH RINSE
15.0000 mL | Freq: Two times a day (BID) | OROMUCOSAL | Status: DC
  Administered 2017-01-04 – 2017-01-05 (×2): 15 mL via OROMUCOSAL

## 2017-01-04 MED ORDER — IPRATROPIUM-ALBUTEROL 0.5-2.5 (3) MG/3ML IN SOLN
3.0000 mL | Freq: Three times a day (TID) | RESPIRATORY_TRACT | Status: DC
  Administered 2017-01-05 – 2017-01-06 (×4): 3 mL via RESPIRATORY_TRACT
  Filled 2017-01-04 (×4): qty 3

## 2017-01-04 MED ORDER — ACETAMINOPHEN 325 MG PO TABS
650.0000 mg | ORAL_TABLET | Freq: Four times a day (QID) | ORAL | Status: DC | PRN
  Administered 2017-01-04 – 2017-01-05 (×3): 650 mg via ORAL
  Filled 2017-01-04 (×3): qty 2

## 2017-01-04 NOTE — Progress Notes (Signed)
Occupational Therapy Evaluation Patient Details Name: Ashley Fritz MRN: [id number] DOB: [birth year] Today's Date: 01/04/2017    History of Present Illness 83 y.o. female with past medical history of CVA, diabetes, hypertension and dyslipidemia transferred from skilled nursing facility for evaluation of hypoxia, found to have hypothermia and clinical features consistent with healthcare associated pneumonia   Clinical Impression   PTA, pt residing at [facility]. Pt is w/c bound and staff uses a hoyer lift to get her into the power chair, which she independently operates. Pt was also able to feed herself. Pt demonstrates a decline in functional status and will benefit from rehab at a SNF. Will follow acutely to address BUE strengthening adna increase independence with self feeding.     Follow Up Recommendations  SNF;Supervision/Assistance - 24 hour    Equipment Recommendations  None recommended by OT    Recommendations for Other Services       Precautions / Restrictions Precautions Precautions: Fall;Other (comment) (skin breakdown)      Mobility Bed Mobility Overal bed mobility: Needs Assistance             General bed mobility comments: total A +2  Transfers                 General transfer comment: requires use of lift equipment    Balance                                           ADL either performed or assessed with clinical judgement   ADL Overall ADL's : Needs assistance/impaired     Grooming: Total assistance   Upper Body Bathing: Total assistance   Lower Body Bathing: Total assistance   Upper Body Dressing : Total assistance   Lower Body Dressing: Total assistance         Toileting - Clothing Manipulation Details (indicate cue type and reason): total A; incontinent       General ADL Comments: Requires use of lift equipment     Vision         Perception     Praxis      Pertinent Vitals/Pain Pain Assessment:  Faces Faces Pain Scale: Hurts little more Pain Location: shoulder/knee joint pain Pain Descriptors / Indicators: Discomfort;Grimacing;Sore Pain Intervention(s): Limited activity within patient's tolerance     Hand Dominance Right   Extremity/Trunk Assessment Upper Extremity Assessment Upper Extremity Assessment: Generalized weakness (unable to lift B UE off bed; general edema B hands)   Lower Extremity Assessment Lower Extremity Assessment: Defer to PT evaluation   Cervical / Trunk Assessment Cervical / Trunk Assessment: Kyphotic   Communication     Cognition Arousal/Alertness: Awake/alert Behavior During Therapy: WFL for tasks assessed/performed Overall Cognitive Status: History of cognitive impairments - at baseline                                     General Comments       Exercises     Shoulder Instructions      Home Living Family/patient expects to be discharged to:: Skilled nursing facility                                        Prior Functioning/Environment  Level of Independence: Needs assistance  Gait / Transfers Assistance Needed: bed - w/c; hoyer ADL's / Homemaking Assistance Needed: A with self care. Pt could feed self            OT Problem List: Decreased strength;Decreased range of motion;Decreased activity tolerance;Impaired balance (sitting and/or standing);Impaired vision/perception;Decreased coordination;Decreased safety awareness;Decreased knowledge of use of DME or AE;Cardiopulmonary status limiting activity;Obesity;Pain;Increased edema      OT Treatment/Interventions: Self-care/ADL training;Therapeutic exercise;Therapeutic activities;Patient/family education    OT Goals(Current goals can be found in the care plan section) Acute Rehab OT Goals Patient Stated Goal: to get stronger OT Goal Formulation: With patient Time For Goal Achievement: 01/18/17 Potential to Achieve Goals: Good ADL Goals Pt Will Perform  Eating: with min assist;with adaptive utensils;sitting Additional ADL Goal #1: Pt/caregiver will complete BUE strenthening HEP with S to increase independence with ADL  OT Frequency: Min 2X/week   Barriers to D/C:            Co-evaluation PT/OT/SLP Co-Evaluation/Treatment: Yes Reason for Co-Treatment: Complexity of the patient's impairments (multi-system involvement)          AM-PAC PT "6 Clicks" Daily Activity     Outcome Measure Help from another person eating meals?: Total Help from another person taking care of personal grooming?: Total Help from another person toileting, which includes using toliet, bedpan, or urinal?: Total Help from another person bathing (including washing, rinsing, drying)?: Total Help from another person to put on and taking off regular upper body clothing?: Total Help from another person to put on and taking off regular lower body clothing?: Total 6 Click Score: 6   End of Session Nurse Communication: Mobility status;Need for lift equipment  Activity Tolerance: Patient tolerated treatment well Patient left: in bed;with call bell/phone within reach;with family/visitor present  OT Visit Diagnosis: Muscle weakness (generalized) (M62.81);Feeding difficulties (R63.3)                Time: [id number] OT Time Calculation (min): 23 min Charges:  OT General Charges $OT Visit: 1 Procedure OT Evaluation $OT Eval Moderate Complexity: 1 Procedure G-Codes:     Ahlayah Tarkowski, OT/L  [phone] 01/04/2017  Christabell Loseke,HILLARY 01/04/2017, 3:34 PM

## 2017-01-04 NOTE — Progress Notes (Signed)
Pt. transferred from 5W to 4NP02, report from Colin BroachKaren S., RN. Pt is alert and responsive to questions, though declines to answer orientation questions, states "I don't know where I am, I don't know my name." Pt opens eyes to voice and exhibits strong grip bilaterally to hands.   Full assessment to Epic; skin assessed with Humberto Leepherry J., RN. Suspected stage II pressure injuries noted to patients left buttocks and left posterior medial thigh. WOC consult placed for further assessment. Sacral foam dsg placed; foam dsg placed to bilateral heels due to boggy texture noted upon assessment; heels elevated off of bed.  Rectal temp. 94.5, bair hugger placed. Other VSS, will continue to monitor closely.

## 2017-01-04 NOTE — Progress Notes (Signed)
Called Report to Maddie on 4N.

## 2017-01-04 NOTE — Progress Notes (Signed)
PROGRESS NOTE        PATIENT DETAILS Name: Ashley Fritz Age: 83 y.o. Sex: female Date of Birth: 1927/04/21 Admit Date: 2017-01-10 Admitting Physician Jonah Blue, MD [id number], No Pcp Per  Brief Narrative: Patient is a 83 y.o. female with past medical history of CVA, diabetes, hypertension and dyslipidemia transferred from skilled nursing facility for evaluation of hypoxia, found to have hypothermia and clinical features consistent with healthcare associated pneumonia, she was subsequently admitted to the [facility] for further evaluation and treatment. Please see below for further details   Subjective: Lethargic but arousable. She answers some questions appropriately. Looks very weak and chronically sick appearing.  Per RN-no nausea, vomiting or diarrhea overnight.  Assessment/Plan: Sepsis secondary to presumed HCAP and a UTI: Continues to be hypothermic-but is otherwise stable. Given prior history of CVA-high suspicion for aspiration pneumonia even though chest x-ray is negative. UA also consistent with UTI. Plans are to continue empiric broad-spectrum antimicrobial therapy until culture data is obtained. Continue warming blanket till her fever improves-will check TSH and cortisol levels. Await SLP evaluation to make sure she is on the right diet.  Acute kidney injury: Likely hemodynamically mediated in the setting of sepsis-resolved with supportive care  Atrial fibrillation: Seen on EKG on admission-telemetry this morning shows mostly sinus bradycardia. Not on any rate control agents-Chadsvasc score of at least 5-given advanced age-frailty-increased fall risk-not a great long-term anti-coagulation candidate. Check echo  Insulin-dependent type 2 diabetes: CBGs stable, continue Lantus 36 units daily at bedtime and SSI. Follow and adjust accordingly.  Hypertension: Controlled-continue to hold antihypertensives for now-follow and resume  accordingly.  Generalized weakness/deconditioning: Secondary to acute illness-spoke with healthcare power of attorney over the phone-at baseline she is able to feed herself, and is mostly bed to wheelchair bound.  History of cervical spine myelopathy/gait disturbance: As noted above mostly bed to wheelchair bound-she has generalized weakness on my exam-he is able to move her legs side to side. Not sure what her usual baseline is-we will need to reevaluate when she is more awake and alert.   Prior history of CVA: Continue aspirin and statin-see above regarding generalized weakness and atrial fibrillation  Goals of care: Poor overall prognoses-very frail-bed to wheelchair bound patient here with probable aspiration pneumonia and UTI. Not a great long-term candidate for anticoagulation for her newly diagnosed atrial fibrillation. Plans are to continue with current care to see if she improves, otherwise will likely need palliative care input. Spoke with patient's healthcare power of attorney over the phone-she is aware of poor overall prognoses-but at this time she remains a full code.  Telemetry (independently reviewed): Sinus bradycardia  Morning labs/Imaging ordered: yes  DVT Prophylaxis: Prophylactic Lovenox   Code Status: Full code   Family Communication: Spoke with healthcare power of attorney over the phone  Disposition Plan: Remain inpatientin stepdown-hopefully back to SNF in the next 2-3 days. -but will plan on Home health vs SNF on discharge  Antimicrobial agents: Anti-infectives    Start     Dose/Rate Route Frequency Ordered Stop   01/04/17 0000  vancomycin (VANCOCIN) 1,250 mg in sodium chloride 0.9 % 250 mL IVPB     1,250 mg 166.7 mL/hr over 90 Minutes Intravenous Every 24 hours 2017-01-10 2346     01/04/17 0000  piperacillin-tazobactam (ZOSYN) IVPB 3.375 g     3.375 g 12.5  mL/hr over 240 Minutes Intravenous Every 8 hours 12/08/2016 2346     12/21/2016 2345  ceFEPIme (MAXIPIME)  2 g in dextrose 5 % 50 mL IVPB  Status:  Discontinued     2 g 100 mL/hr over 30 Minutes Intravenous  Once 12/13/2016 2333 12/22/2016 2340   12/06/2016 2345  vancomycin (VANCOCIN) IVPB 1000 mg/200 mL premix  Status:  Discontinued     1,000 mg 200 mL/hr over 60 Minutes Intravenous  Once 01/02/2017 2333 12/28/2016 2346   12/26/2016 1945  cefTRIAXone (ROCEPHIN) 1 g in dextrose 5 % 50 mL IVPB     1 g 100 mL/hr over 30 Minutes Intravenous  Once 12/28/2016 1930 12/13/2016 2105   01/01/2017 1945  azithromycin (ZITHROMAX) 500 mg in dextrose 5 % 250 mL IVPB     500 mg 250 mL/hr over 60 Minutes Intravenous  Once 12/08/2016 1930 12/16/2016 2135      Procedures: None  CONSULTS:  None  Time spent: 25 minutes-Greater than 50% of this time was spent in counseling, explanation of diagnosis, planning of further management, and coordination of care.  MEDICATIONS: Scheduled Meds: . aspirin  81 mg Oral Daily  . atorvastatin  20 mg Oral QHS  . chlorhexidine  15 mL Mouth/Throat BID  . docusate sodium  100 mg Oral Daily  . enoxaparin (LOVENOX) injection  40 mg Subcutaneous QHS  . gabapentin  300 mg Oral TID  . insulin aspart  0-15 Units Subcutaneous TID WC  . insulin glargine  36 Units Subcutaneous QHS  . ipratropium-albuterol  3 mL Nebulization Q6H  . lamoTRIgine  200 mg Oral QHS  . latanoprost  1 drop Both Eyes QHS  . loratadine  10 mg Oral Daily  . mouth rinse  15 mL Mouth Rinse BID  . pantoprazole  40 mg Oral Daily  . polyethylene glycol  17 g Oral Daily   Continuous Infusions: . sodium chloride 75 mL/hr at 01/04/17 0300  . piperacillin-tazobactam (ZOSYN)  IV 3.375 g (01/04/17 0830)  . vancomycin Stopped (01/04/17 0248)   PRN Meds:.   PHYSICAL EXAM: Vital signs: Vitals:   01/04/17 0700 01/04/17 0715 01/04/17 0738 01/04/17 0855  BP: (!) 90/45 (!) 100/58  (!) 99/51  Pulse: (!) 58 73  69  Resp: 17 17  19   Temp:   98.4 F (36.9 C)   TempSrc:   Oral   SpO2: 99% 99%  97%  Weight:       Filed Weights     12/14/2016 2300 01/04/17 0350  Weight: 96.2 kg (212 lb) 94.2 kg (207 lb 11.2 oz)   Body mass index is 37.99 kg/m.   General appearance :Awake, But very sleepy and lethargic. Speech in short sentences and goes back to sleep.  Eyes:, pupils equally reactive to light and accomodation HEENT: Atraumatic and Normocephalic Neck: supple, no JVD. Resp:Good air entry bilaterally-anteriorly  CVS: S1 S2 irregular, soft systolic murmur heard in the right base  GI: Bowel sounds present, Non tender and not distended with no gaurding, rigidity or rebound. Extremities: B/L Lower Ext shows no edema, both legs are warm to touch Neurology: Very weak all over-but seems to move all 4 extremities-difficult exam  Psychiatric: Very lethargic-but follows commands.  Musculoskeletal:No digital cyanosis Skin:No Rash, warm and dry Wounds:N/A  I have personally reviewed following labs and imaging studies  LABORATORY DATA: CBC:  Recent Labs Lab 12/30/2016 1905 12/23/2016 1928 01/04/17 0159  WBC 6.1  --  5.1  NEUTROABS 3.3  --  2.6  HGB 12.4 13.9 13.1  HCT 40.0 41.0 42.6  MCV 92.0  --  92.2  PLT 172  --  178    Basic Metabolic Panel:  Recent Labs Lab 12/25/2016 1905 12/17/2016 1928 01/04/17 0159  NA 135 135 137  K 4.3 4.3 4.1  CL 93* 91* 97*  CO2 34*  --  30  GLUCOSE 165* 157* 162*  BUN 19 23* 15  CREATININE 1.09* 1.10* 0.80  CALCIUM 9.2  --  9.0    GFR: CrCl cannot be calculated (Unknown ideal weight.).  Liver Function Tests:  Recent Labs Lab 12/23/2016 1905  AST 20  ALT 13*  ALKPHOS 51  BILITOT 0.7  PROT 5.8*  ALBUMIN 3.3*   No results for input(s): LIPASE, AMYLASE in the last 168 hours.  Recent Labs Lab 12/25/2016 1905  AMMONIA 17    Coagulation Profile:  Recent Labs Lab 12/24/2016 1905  INR 1.06    Cardiac Enzymes: No results for input(s): CKTOTAL, CKMB, CKMBINDEX, TROPONINI in the last 168 hours.  BNP (last 3 results) No results for input(s): PROBNP in the last 8760  hours.  HbA1C: No results for input(s): HGBA1C in the last 72 hours.  CBG:  Recent Labs Lab 01/02/2017 1917 01/04/17 0137 01/04/17 0734  GLUCAP 139* 161* 115*    Lipid Profile: No results for input(s): CHOL, HDL, LDLCALC, TRIG, CHOLHDL, LDLDIRECT in the last 72 hours.  Thyroid Function Tests: No results for input(s): TSH, T4TOTAL, FREET4, T3FREE, THYROIDAB in the last 72 hours.  Anemia Panel: No results for input(s): VITAMINB12, FOLATE, FERRITIN, TIBC, IRON, RETICCTPCT in the last 72 hours.  Urine analysis:    Component Value Date/Time   COLORURINE AMBER (A) 12/18/2016 1900   APPEARANCEUR TURBID (A) 12/27/2016 1900   LABSPEC 1.015 12/30/2016 1900   PHURINE 5.0 12/14/2016 1900   GLUCOSEU NEGATIVE 12/07/2016 1900   HGBUR SMALL (A) 12/27/2016 1900   BILIRUBINUR NEGATIVE 12/31/2016 1900   KETONESUR NEGATIVE 01/02/2017 1900   PROTEINUR 30 (A) 12/15/2016 1900   UROBILINOGEN 1.0 09/23/2010 1411   NITRITE NEGATIVE 12/21/2016 1900   LEUKOCYTESUR MODERATE (A) 12/19/2016 1900    Sepsis Labs: Lactic Acid, Venous    Component Value Date/Time   LATICACIDVEN 1.8 01/04/2017 0159    MICROBIOLOGY: No results found for this or any previous visit (from the past 240 hour(s)).  RADIOLOGY STUDIES/RESULTS: Dg Chest 2 View  Result Date: 12/09/2016 CLINICAL DATA:  Altered mental status. EXAM: CHEST  2 VIEW COMPARISON:  02/03/2015 FINDINGS: There is marked cardiac enlargement. Aortic atherosclerosis noted. Diminished lung volumes. This is particularly notable involving the lateral chest radiograph. There is no pleural effusion or edema. IMPRESSION: 1. Cardiac enlargement and low lung volumes. 2.  Aortic Atherosclerosis (ICD10-I70.0). Electronically Signed   By: Signa Kellaylor  Stroud M.D.   On: 12/19/2016 18:41   Ct Head Wo Contrast  Result Date: 01/04/2017 CLINICAL DATA:  Altered mentation EXAM: CT HEAD WITHOUT CONTRAST TECHNIQUE: Contiguous axial images were obtained from the base of the skull  through the vertex without intravenous contrast. COMPARISON:  04/17/2010 head CT, 08/16/2010 MRI FINDINGS: BRAIN: There is sulcal and ventricular prominence consistent with superficial and central atrophy. No intraparenchymal hemorrhage, mass effect nor midline shift. Periventricular and subcortical white matter hypodensities consistent with chronic small vessel ischemic disease are identified. No acute large vascular territory infarcts. No abnormal extra-axial fluid collections. Basal cisterns are not effaced and midline. VASCULAR: Moderate calcific atherosclerosis of the carotid siphons. SKULL: No skull fracture. No significant scalp soft tissue swelling.  SINUSES/ORBITS: The mastoid air-cells are clear. The included paranasal sinuses are well-aerated.The included ocular globes and orbital contents are non-suspicious. Prior right lens surgical change. OTHER: Partial opacification of the mastoid air cells bilaterally appear IMPRESSION: 1. Atrophy with chronic small vessel ischemia. 2. No acute intracranial abnormality. 3. Small bilateral chronic mastoid effusions. Electronically Signed   By: Tollie Eth M.D.   On: 01/06/2017 19:03   Ct Chest W Contrast  Result Date: 2017-01-06 CLINICAL DATA:  83 y/o  F; altered mental status, low oxygen level. EXAM: CT CHEST WITH CONTRAST TECHNIQUE: Multidetector CT imaging of the chest was performed during intravenous contrast administration. CONTRAST:  75mL ISOVUE-300 IOPAMIDOL (ISOVUE-300) INJECTION 61% COMPARISON:  02/03/2015 CT chest.  January 06, 2017 chest radiograph. FINDINGS: Cardiovascular: Moderate to severe cardiomegaly. Severe coronary artery calcification. Mitral annular and aortic valvular calcification. No pericardial effusion. Normal caliber thoracic aorta with mild calcific atherosclerosis. Mediastinum/Nodes: Normal thyroid gland. No mediastinal adenopathy. There is apparent debris in the central airways, fluid levels in lobar and segmental airways, and mucous  plugging in the lung bases. Lungs/Pleura: Consolidation within the left upper lobe along the major fissure extending into the lingula and patchy opacification in dependent lower lobes bilaterally. Upper Abdomen: Subcentimeter cyst within the left kidney interpolar region. Musculoskeletal: Extensive degenerative changes of the thoracic spine. No acute osseous abnormality is identified. IMPRESSION: 1. Debris within the central airways extending into the lung bases, distribution suggest aspiration. 2. Dependent consolidation within the left upper lobe along the major fissure and in dependent lower lobes may represent atelectasis or pneumonia. 3. Moderate to severe cardiomegaly. Coronary and aortic calcific atherosclerosis. 4. Aortic valvular and mitral annular calcification, correlate for valvular dysfunction. Electronically Signed   By: Mitzi Hansen M.D.   On: January 06, 2017 20:47     LOS: 1 day   Jeoffrey Massed, MD  [facility] Pager:[phone]  If 7PM-7AM, please contact night-coverage [email or web address] Password [facility] 01/04/2017, 9:24 AM

## 2017-01-04 NOTE — Progress Notes (Signed)
Patient arrived to floor via stretcher from ED. Patient alert, able to tell me name, date of birth and [facility]  Cardiac monitor #19 applied and verified by 2 staff. Patient cleaned up, skin assessment by 2 RNs revealed no breakdown. External catheter applied. Bed alarm on, side rails up X 2. Safety maintained.

## 2017-01-04 NOTE — Consult Note (Signed)
WOC Nurse wound consult note Reason for Consult: buttocks and thighs Patient from SNF, incontinent of bowel and bladder, using incontinence briefs PTA. Wound type: MASD, healing. Skin intact  Pressure Injury POA: No She has a couple of areas that are healed, pink in color but healed She has a PureWick in place for management of her incontinence. Verified correct placement while I was in for assessment of the skin.  Continue PureWick for management of urinary incontinence.    Discussed POC with bedside nurse.  Re consult if needed, will not follow at this time. Thanks  Chelli Yerkes M[employer] MSN, RN,CWOCN, CNS [phone])

## 2017-01-04 NOTE — Evaluation (Signed)
Physical Therapy Evaluation Patient Details Name: Ashley Fritz MRN: [id number] DOB: [birth year] Today's Date: 01/04/2017   History of Present Illness  83 y.o. female with past medical history of CVA, diabetes, hypertension and dyslipidemia transferred from skilled nursing facility for evaluation of hypoxia, found to have hypothermia and clinical features consistent with healthcare associated pneumonia  Clinical Impression  Patient presents with decreased AROM/strength BLEs and UEs and impaired mobility s/p above. Pt from [facility] and has been living there for the last 6 years. Pt is bed bound and w/c bound (motorized) at baseline using hoyer lift to transfer and dependence for self care. Pt was able to feed self PTA. Tolerated A/AROM BLEs. Incorpotated music into session to encourage pt participation/motivation. Defer PT to SNF to improve mobility, skin integrity/positioning and allow for LE mobility to allow pt to sit in motorized chair. Will sign off as pt functioning close to baseline. Discharge from therapy.    Follow Up Recommendations SNF    Equipment Recommendations  None recommended by PT    Recommendations for Other Services       Precautions / Restrictions Precautions Precautions: Fall;Other (comment) (skin breakdown) Restrictions Weight Bearing Restrictions: No      Mobility  Bed Mobility Overal bed mobility: Needs Assistance             General bed mobility comments: total A +2  Transfers                 General transfer comment: requires use of lift equipment  Ambulation/Gait                Stairs            Wheelchair Mobility    Modified Rankin (Stroke Patients Only)       Balance                                             Pertinent Vitals/Pain Pain Assessment: Faces Faces Pain Scale: Hurts little more Pain Location: shoulder/knee joint pain Pain Descriptors / Indicators:  Discomfort;Grimacing;Sore Pain Intervention(s): Limited activity within patient's tolerance    Home Living Family/patient expects to be discharged to:: Skilled nursing facility                      Prior Function Level of Independence: Needs assistance   Gait / Transfers Assistance Needed: bed - w/c; hoyer  ADL's / Homemaking Assistance Needed: A with self care. Pt could feed self        Hand Dominance   Dominant Hand: Right    Extremity/Trunk Assessment   Upper Extremity Assessment Upper Extremity Assessment: Generalized weakness (unable to lift B UE off bed; general edema B hands)    Lower Extremity Assessment Lower Extremity Assessment: Generalized weakness (trace quads in BLEs, toe movement but limited AROM knee flexion.)    Cervical / Trunk Assessment Cervical / Trunk Assessment: Kyphotic  Communication      Cognition Arousal/Alertness: Awake/alert Behavior During Therapy: WFL for tasks assessed/performed Overall Cognitive Status: History of cognitive impairments - at baseline                                        General Comments      Exercises General Exercises -  Lower Extremity Ankle Circles/Pumps: Both;5 reps;Supine Quad Sets: Both;5 reps;Supine Heel Slides: AAROM;Both;5 reps;Supine   Assessment/Plan    PT Assessment All further PT needs can be met in the next venue of care  PT Problem List Decreased strength;Decreased mobility;Pain;Decreased cognition;Decreased range of motion;Impaired tone;Decreased activity tolerance;Decreased skin integrity;Decreased balance       PT Treatment Interventions      PT Goals (Current goals can be found in the Care Plan section)  Acute Rehab PT Goals Patient Stated Goal: to get stronger and be able to walk PT Goal Formulation: With patient    Frequency     Barriers to discharge        Co-evaluation   Reason for Co-Treatment: Complexity of the patient's impairments (multi-system  involvement)           AM-PAC PT "6 Clicks" Daily Activity  Outcome Measure Difficulty turning over in bed (including adjusting bedclothes, sheets and blankets)?: Total Difficulty moving from lying on back to sitting on the side of the bed? : Total Difficulty sitting down on and standing up from a chair with arms (e.g., wheelchair, bedside commode, etc,.)?: Total Help needed moving to and from a bed to chair (including a wheelchair)?: Total Help needed walking in hospital room?: Total Help needed climbing 3-5 steps with a railing? : Total 6 Click Score: 6    End of Session Equipment Utilized During Treatment: Oxygen Activity Tolerance: Patient tolerated treatment well Patient left: in bed;with call bell/phone within reach;with family/visitor present Nurse Communication: Mobility status;Need for lift equipment PT Visit Diagnosis: Muscle weakness (generalized) (M62.81);Pain Pain - Right/Left:  (both) Pain - part of body: Shoulder    Time: 1421-1441 PT Time Calculation (min) (ACUTE ONLY): 20 min   Charges:   PT Evaluation $PT Eval Low Complexity: 1 Procedure     PT G Codes:        Wray Kearns, PT, DPT [phone]    Marguarite Arbour A Anacarolina Evelyn 01/04/2017, 4:06 PM

## 2017-01-04 NOTE — Progress Notes (Signed)
Modified Barium Swallow Progress Note  Patient Details  Name: Ashley Fritz MRN: [id number] Date of Birth: 05/12/1927  Today's Date: 01/04/2017  Modified Barium Swallow completed.  Full report located under Chart Review in the Imaging Section.  Brief recommendations include the following:  Clinical Impression  Pt demosntrates mild oral dysphagia secondary to missing dentition and mild lethargy characterized by lingual pumping, poor ability to masticate. Oropharyngeal function WNL. No penetration or aspiration, swallow is timely and strong. Esophageal sweep WNL. Recommend a dys (finely chopped) diet with thin liquids.  No SLP f/u needed, will sign off.    Swallow Evaluation Recommendations       SLP Diet Recommendations: Dysphagia 2 (Fine chop) solids;Thin liquid   Liquid Administration via: Cup;Straw       Supervision: Staff to assist with self feeding   Compensations: Slow rate;Small sips/bites   Postural Changes: Seated upright at 90 degrees   Oral Care Recommendations: Oral care BID        Domanic Matusek, Riley NearingBonnie Caroline 01/04/2017,2:40 PM

## 2017-01-04 NOTE — Progress Notes (Signed)
Patient temp continues to be 94.4, after attempting to warm her with blankets. Will page Dr. Fredrich RomansKrakakandy.  [id number] Ashley Fritz, Ashley Fritz patient continues to be be 94.4, after warming blankets. She needs to be transferred for bearhugger.  Thank you. Awaiting call back.

## 2017-01-04 NOTE — Progress Notes (Signed)
Rectal temp is 94.4. RN notified Ashley CongoYates,MD. Yates,MD returned page and informed this RN to place warm blankets on patient for 1 hour and then recheck her rectal temp. Will complete orders and recheck temp in 1 hour. Will continue to monitor and treat per MD orders.

## 2017-01-05 ENCOUNTER — Inpatient Hospital Stay (HOSPITAL_COMMUNITY): Payer: Medicare Other

## 2017-01-05 ENCOUNTER — Ambulatory Visit: Payer: Medicare Other | Admitting: Podiatry

## 2017-01-05 DIAGNOSIS — E1169 Type 2 diabetes mellitus with other specified complication: Secondary | ICD-10-CM

## 2017-01-05 DIAGNOSIS — I4892 Unspecified atrial flutter: Secondary | ICD-10-CM

## 2017-01-05 DIAGNOSIS — J9621 Acute and chronic respiratory failure with hypoxia: Secondary | ICD-10-CM

## 2017-01-05 DIAGNOSIS — J189 Pneumonia, unspecified organism: Secondary | ICD-10-CM

## 2017-01-05 DIAGNOSIS — I4891 Unspecified atrial fibrillation: Secondary | ICD-10-CM

## 2017-01-05 DIAGNOSIS — Z515 Encounter for palliative care: Secondary | ICD-10-CM

## 2017-01-05 DIAGNOSIS — R4182 Altered mental status, unspecified: Secondary | ICD-10-CM

## 2017-01-05 DIAGNOSIS — E669 Obesity, unspecified: Secondary | ICD-10-CM

## 2017-01-05 DIAGNOSIS — L8990 Pressure ulcer of unspecified site, unspecified stage: Secondary | ICD-10-CM | POA: Insufficient documentation

## 2017-01-05 DIAGNOSIS — I350 Nonrheumatic aortic (valve) stenosis: Secondary | ICD-10-CM

## 2017-01-05 LAB — GLUCOSE, CAPILLARY
GLUCOSE-CAPILLARY: 98 mg/dL (ref 65–99)
GLUCOSE-CAPILLARY: 130 mg/dL — AB (ref 65–99)
GLUCOSE-CAPILLARY: 37 mg/dL — AB (ref 65–99)
GLUCOSE-CAPILLARY: 99 mg/dL (ref 65–99)
Glucose-Capillary: 31 mg/dL — CL (ref 65–99)

## 2017-01-05 LAB — CBC
HEMATOCRIT: 42.6 % (ref 36.0–46.0)
Hemoglobin: 12.7 g/dL (ref 12.0–15.0)
MCH: 27.9 pg (ref 26.0–34.0)
MCHC: 29.8 g/dL — ABNORMAL LOW (ref 30.0–36.0)
MCV: 93.6 fL (ref 78.0–100.0)
PLATELETS: 184 10*3/uL (ref 150–400)
RBC: 4.55 MIL/uL (ref 3.87–5.11)
RDW: 17.3 % — AB (ref 11.5–15.5)
WBC: 5 10*3/uL (ref 4.0–10.5)

## 2017-01-05 LAB — HEMOGLOBIN A1C
Hgb A1c MFr Bld: 7.7 % — ABNORMAL HIGH (ref 4.8–5.6)
Mean Plasma Glucose: 174 mg/dL

## 2017-01-05 LAB — BASIC METABOLIC PANEL
ANION GAP: 7 (ref 5–15)
BUN: 8 mg/dL (ref 6–20)
CALCIUM: 8.5 mg/dL — AB (ref 8.9–10.3)
CO2: 31 mmol/L (ref 22–32)
Chloride: 104 mmol/L (ref 101–111)
Creatinine, Ser: 0.48 mg/dL (ref 0.44–1.00)
GLUCOSE: 46 mg/dL — AB (ref 65–99)
POTASSIUM: 4.1 mmol/L (ref 3.5–5.1)
Sodium: 142 mmol/L (ref 135–145)

## 2017-01-05 LAB — MAGNESIUM: MAGNESIUM: 1.8 mg/dL (ref 1.7–2.4)

## 2017-01-05 LAB — ECHOCARDIOGRAM COMPLETE
HEIGHTINCHES: 67 in
Weight: 3323.2 oz

## 2017-01-05 MED ORDER — ACETYLCYSTEINE 10 % IN SOLN
4.0000 mL | Freq: Three times a day (TID) | RESPIRATORY_TRACT | Status: DC
  Filled 2017-01-05 (×4): qty 4

## 2017-01-05 MED ORDER — DEXTROSE 50 % IV SOLN
INTRAVENOUS | Status: AC
  Administered 2017-01-05: 22:00:00
  Filled 2017-01-05: qty 50

## 2017-01-05 MED ORDER — DEXTROSE 50 % IV SOLN
INTRAVENOUS | Status: AC
  Administered 2017-01-05: 50 mL
  Filled 2017-01-05: qty 50

## 2017-01-05 MED ORDER — INSULIN GLARGINE 100 UNIT/ML ~~LOC~~ SOLN
18.0000 [IU] | Freq: Every day | SUBCUTANEOUS | Status: DC
  Filled 2017-01-05: qty 0.18

## 2017-01-05 NOTE — Consult Note (Signed)
Consultation Note Date: 01/05/2017   Patient Name: Ashley Fritz  DOB: 1927/06/06  MRN: [id number]  Age / Sex: 83 y.o., female  PCP: Patient, No Pcp Per Referring Physician: Maretta Bees, MD  Reason for Consultation: Establishing goals of care and Psychosocial/spiritual support  HPI/Patient Profile: 83 y.o. female  with past medical history of CVA, diabetes, hypertension, dyslipidemia, debility, cervical spine myelopathy admitted on 12/22/2016 with hypoxia and hypothermia. Patient was found to have healthcare associated pneumonia and has been started on antibiotics. Her lactic acid on admission was 2.05. Patient also exhibiting new features of atrial fib..   Clinical Assessment and Goals of Care: Patient has had a fluctuating clinical course in that on 01/05/2017 she became acutely short of breath and placed on a 12 L Ventimask , hypoxia present. Stat chest x-ray was performed and showed a left lung white out likely due to mucous plugging. Patient did cough and ultimately was able to be transitioned off of a Venti mask to nasal cannula. At the time I saw her today she was on the nasal cannula and told me she did not wish to speak to anyone and for me to "leave her alone".  Patient is the surviving member of her family. She is a widow and has no children. She has one living relative and that is her niece, Harvie Heck. Mrs. Segars is listed as the healthcare power of attorney by default at the nursing home. Her friend Eustace Pen, has power of attorney for her financial concerns. Per Mrs. Segars patient has not been out of bed to the wheelchair in 3 weeks; when she speaks to her she sounds more confused and "just not herself". I did introduce the concept of advance directive specifically CODE STATUS in the setting of her fragile cardiopulmonary status. She states in the past she is attempted to talk to her  aunt about advanced directives but her aunt has said she wants "everything done". Mrs. Segars was present for conversation with the nurse practitioner at the facility and patient where more specific goals were addressed such as CODE STATUS. I did define those terms in more detail for her niece.  Patient at this point is unable to speak on behalf of herself secondary to clinical decline and her next of kin, Harvie Heck, would be her healthcare proxy. As noted above patient is widowed, with no biological children no living siblings and her 2 nieces are her only living biological family. Harvie Heck [phone]    SUMMARY OF RECOMMENDATIONS   Defined full code and DO NOT RESUSCITATE. Plan is to follow up with patient's niece at 50 AM on 01/06/2017 for clinical update as well as  Further discussion of  goals of care  Code Status/Advance Care Planning:  Full code Palliative Prophylaxis:   Aspiration, Bowel Regimen, Delirium Protocol, Frequent Pain Assessment, Oral Care and Turn Reposition  Additional Recommendations (Limitations, Scope, Preferences):  Full Scope Treatment  Psycho-social/Spiritual:   Desire for further Chaplaincy support:no  Prognosis:   Potentially days to  weeks prognosis given patient's fragile and clinically fluctuating status;PNA ( likely aspiration ) high risk for cardiopulmonary failure, debility   Discharge Planning: To Be Determined      Primary Diagnoses: Present on Admission: . HCAP (healthcare-associated pneumonia) . Diabetes mellitus type 2 in obese (HCC) . Essential hypertension . Sepsis (HCC) . Atrial fibrillation and flutter (HCC) . UTI . AKI (acute kidney injury) (HCC)   I have reviewed the medical record, interviewed the patient and family, and examined the patient. The following aspects are pertinent.  Past Medical History:  Diagnosis Date  . Acute, but ill-defined, cerebrovascular disease 01/16/2012  . ANEMIA-NOS   . AORTIC  STENOSIS/ INSUFFICIENCY, NON-RHEUMATIC   . Cataracts, bilateral   . CVA 07/2008   no residual deficits  . DIABETES MELLITUS, TYPE II   . GAIT DISTURBANCE    multifactorial: cervical myelopathy, DDD lumbar spine and knee OA  . HYPERLIPIDEMIA   . HYPERTENSION   . LOW BACK PAIN   . Muscle weakness (generalized) 11/30/2010  . MYELOPATHY, CERVICAL SPINE    s/p ACDF 10/2010 C3-4, residual BLE weakness, WC bound since 08/2010 but improved use BUE  . OSTEOARTHRITIS    R>L knee  . PRESSURE ULCER LOWER BACK 08/26/2010   Social History   Social History  . Marital status: Divorced    Spouse name: N/A  . Number of children: N/A  . Years of education: N/A   Social History Main Topics  . Smoking status: Never Smoker  . Smokeless tobacco: Never Used     Comment: since 09/2010 neck surgery, [facility], then with friend, then [facility]  . Alcohol use No  . Drug use: No  . Sexual activity: Not Asked   Other Topics Concern  . None   Social History Narrative  . None   Family History  Problem Relation Age of Onset  . Diabetes Mother   . Alcohol abuse Father    Scheduled Meds: . acetylcysteine  4 mL Nebulization TID  . aspirin  81 mg Oral Daily  . atorvastatin  20 mg Oral QHS  . chlorhexidine  15 mL Mouth/Throat BID  . docusate sodium  100 mg Oral Daily  . enoxaparin (LOVENOX) injection  40 mg Subcutaneous QHS  . gabapentin  300 mg Oral TID  . insulin aspart  0-15 Units Subcutaneous TID WC  . insulin glargine  18 Units Subcutaneous QHS  . ipratropium-albuterol  3 mL Nebulization TID  . lamoTRIgine  200 mg Oral QHS  . latanoprost  1 drop Both Eyes QHS  . loratadine  10 mg Oral Daily  . mouth rinse  15 mL Mouth Rinse BID  . pantoprazole  40 mg Oral Daily  . polyethylene glycol  17 g Oral Daily   Continuous Infusions: . piperacillin-tazobactam (ZOSYN)  IV Stopped (01/05/17 1305)   PRN Meds:.acetaminophen, MUSCLE RUB Medications Prior to Admission:  Prior to  Admission medications   Medication Sig Start Date End Date Taking? Authorizing Provider  acetaminophen (TYLENOL) 325 MG tablet Take 650 mg by mouth 2 (two) times daily.   Yes [provider]  amLODipine (NORVASC) 10 MG tablet Take 10 mg by mouth daily.   Yes [provider]  aspirin 81 MG tablet Take 81 mg by mouth daily.     Yes [provider]  atorvastatin (LIPITOR) 20 MG tablet Take 20 mg by mouth at bedtime.    Yes [provider]  Capsaicin (CAPZASIN-HP) 0.1 % CREA Apply topically 3 (  three) times daily. To bilateral knees and bilateral Extremities   Yes [provider]  cefTRIAXone (ROCEPHIN) 1 g injection Inject 1 g into the muscle once.   Yes [provider]  chlorhexidine (PERIDEX) 0.12 % solution Use as directed 15 mLs in the mouth or throat 2 (two) times daily.   Yes [provider]  Cholecalciferol (VITAMIN D) 2000 units tablet Take 2,000 Units by mouth daily.   Yes [provider]  Docusate Sodium (STOOL SOFTENER) 100 MG capsule Take 100 mg by mouth daily.    Yes [provider]  furosemide (LASIX) 20 MG tablet Take 20 mg by mouth daily.   Yes [provider]  gabapentin (NEURONTIN) 300 MG capsule Take 1 capsule (300 mg total) by mouth 3 (three) times daily. 11/13/11  Yes Newt Lukes, MD  insulin glargine (LANTUS) 100 UNIT/ML injection Inject 36 Units into the skin at bedtime.    Yes [provider]  insulin lispro (HUMALOG) 100 UNIT/ML injection Inject 2-5 Units into the skin See admin instructions. 2 units every morning at 11:30 am, if Blood Glucose is over 90 then use 5 units instead   Yes [provider]  ipratropium-albuterol (DUONEB) 0.5-2.5 (3) MG/3ML SOLN Take 3 mLs by nebulization every 6 (six) hours.   Yes [provider]  lamoTRIgine (LAMICTAL) 200 MG tablet Take 200 mg by mouth at bedtime.   Yes [provider]  latanoprost (XALATAN) 0.005 %  ophthalmic solution Place 1 drop into both eyes at bedtime.   Yes [provider]  loratadine (CLARITIN) 10 MG tablet Take 10 mg by mouth daily.   Yes [provider]  losartan (COZAAR) 100 MG tablet Take 100 mg by mouth daily.   Yes [provider]  Menthol, Topical Analgesic, (BIOFREEZE EX) Apply topically 4 (four) times daily.   Yes [provider]  Multiple Vitamins-Minerals (CERTA-VITE PO) Take 1 tablet by mouth daily.    Yes [provider]  nitroGLYCERIN (NITROSTAT) 0.4 MG SL tablet Place 0.4 mg under the tongue every 5 (five) minutes as needed for chest pain. May repeat for 2 doses as needed for chest pain.   Yes [provider]  omeprazole (PRILOSEC) 40 MG capsule Take 40 mg by mouth daily. For reflux   Yes [provider]  polyethylene glycol (MIRALAX / GLYCOLAX) packet Take 17 g by mouth daily.   Yes [provider]  Skin Protectants, Misc. (EUCERIN) cream Apply 1 application topically at bedtime. To Legs and Feet   Yes [provider]   No Known Allergies Review of Systems  Unable to perform ROS: Mental status change    Physical Exam  Constitutional: She appears well-developed and well-nourished.  HENT:  Head: Normocephalic and atraumatic.  Cardiovascular: Normal rate and regular rhythm.   Pulmonary/Chest: Effort normal.  Abdominal: Soft.  Neurological:  somnalent  Skin: Skin is warm and dry.  Psychiatric:  Mood irritable otherwise unable to test  Nursing note and vitals reviewed.   Vital Signs: BP 134/89   Pulse (!) 57   Temp 98.2 F (36.8 C) (Oral)   Resp (!) 25   Ht 5\' 7"  (1.702 m)   Wt 94.2 kg (207 lb 11.2 oz)   SpO2 98%   BMI 32.53 kg/m  Pain Assessment: No/denies pain   Pain Score: Asleep   SpO2: SpO2: 98 % O2 Device:SpO2: 98 % O2 Flow Rate: .O2 Flow Rate (L/min): 4 L/min  IO: Intake/output summary:  Intake/Output Summary (  Last 24 hours) at 01/05/17 1629 Last data  filed at 01/05/17 1600  Gross per 24 hour  Intake             1825 ml  Output              750 ml  Net             1075 ml    LBM: Last BM Date: 01/04/17 Baseline Weight: Weight: 96.2 kg (212 lb) Most recent weight: Weight: 94.2 kg (207 lb 11.2 oz)     Palliative Assessment/Data:   Flowsheet Rows     Most Recent Value  Intake Tab  Referral Department  Hospitalist  Unit at Time of Referral  Intermediate Care Unit  Palliative Care Primary Diagnosis  Sepsis/Infectious Disease  Date Notified  01/05/17  Palliative Care Type  New Palliative care  Reason for referral  Clarify Goals of Care  Date of Admission  12/13/2016  Date first seen by Palliative Care  01/05/17  # of days Palliative referral response time  0 Day(s)  # of days IP prior to Palliative referral  2  Clinical Assessment  Palliative Performance Scale Score  30%  Pain Max last 24 hours  Not able to report  Pain Min Last 24 hours  Not able to report  Dyspnea Max Last 24 Hours  Not able to report  Dyspnea Min Last 24 hours  Not able to report  Nausea Max Last 24 Hours  Not able to report  Nausea Min Last 24 Hours  Not able to report  Anxiety Max Last 24 Hours  Not able to report  Anxiety Min Last 24 Hours  Not able to report  Other Max Last 24 Hours  Not able to report  Psychosocial & Spiritual Assessment  Palliative Care Outcomes  Patient/Family meeting held?  Yes  Who was at the meeting?  pt and pt's niece via phone  Palliative Care follow-up planned  Yes, Facility      Time In: 1530 Time Out: 1630 Time Total: 60 min Greater than 50%  of this time was spent counseling and coordinating care related to the above assessment and plan. Staffed with Dr. Jerral Ralph  Signed by: Irean Hong, NP   Please contact Palliative Medicine Team phone at [phone] for questions and concerns.  For individual provider: See Loretha Stapler

## 2017-01-05 NOTE — Progress Notes (Signed)
  Echocardiogram 2D Echocardiogram has been performed.  Arvil ChacoFoster, Worley Radermacher 01/05/2017, 10:39 AM

## 2017-01-05 NOTE — Progress Notes (Signed)
PROGRESS NOTE        PATIENT DETAILS Name: Ashley Fritz Age: 83 y.o. Sex: female Date of Birth: 1926/09/11 Admit Date: 12/16/2016 Admitting Physician Jonah Blue, MD ZOX:[id number], No Pcp Per  Brief Narrative: Patient is a 83 y.o. female with past medical history of CVA, diabetes, hypertension and dyslipidemia transferred from skilled nursing facility for evaluation of hypoxia, found to have hypothermia and clinical features consistent with healthcare associated pneumonia, she was subsequently admitted to the [facility] for further evaluation and treatment. Please see below for further details   Subjective: Much more awake and alert compared to yesterday-hypoglycemic this morning. Per RN-no major issues overnight-no longer on [employer]  Assessment/Plan: Sepsis secondary to presumed HCAP and a UTI: Significantly improved-sepsis pathophysiology has resolved. Etiology of sepsis likely secondary to aspiration pneumonia and for UTI. Hypothermia has resolved, since her blood cultures negative-we'll stop vancomycin and discontinue Zosyn and await further culture data. Suspect that if clinical improvement continues, she should be able to be discharged back to a skilled nursing facility in the next 1-2 days. Note TSH/cortisol level within normal limits-done as part of workup for hypothermia.   Acute kidney injury: Likely hemodynamically mediated in the setting of sepsis-resolved with supportive care  Atrial fibrillation: Seen on EKG on admission-telemetry shows sinus rhythm this morning. Rate controlled without any use of rate controlling agents, chadsvasc score of at least 5-given advanced age-frailty-increased fall risk-not a great long-term anti-coagulation candidate. Await echo. Remains on aspirin  Insulin-dependent type 2 diabetes: Hypoglycemic this morning-decrease Lantus to 18 units, continue SSI. Follow and adjust accordingly   Hypertension: BP  controlled without the use of any antihypertensives-follow and resume accordingly.   Dysphagia: Likely due to prior history of CVA-assessed by SLP on 5/31-recommendations are for dysphagia 2 diet.   Generalized weakness/deconditioning: Secondary to acute illness-spoke with healthcare power of attorney over the phone on 5/31-at baseline she is able to feed herself, and is mostly bed to wheelchair bound. PT evaluation completed, recommendations are to discharge back to SNF on discharge.  History of cervical spine myelopathy/gait disturbance: As noted above mostly bed to wheelchair bound-she has generalized weakness on my exam-he is able to move her legs side to side. I suspect that she is not that far from her usual baseline-she may have more deconditioning/weakness due to acute illness. Plans are for discharge back to SNF on discharge.  Prior history of CVA: Continue aspirin and statin-see above regarding generalized weakness and atrial fibrillation  Moisture associated skin damage in bilateral buttocks/thighs: Appreciate wound care evaluation-continue with supportive care  Goals of care: Poor overall prognoses-very frail-bed to wheelchair bound patient here with probable aspiration pneumonia and UTI. Not a great long-term candidate for anticoagulation for her newly diagnosed atrial fibrillation. Slowly improving with supportive measures, patient's healthcare power of attorney aware of poor overall prognoses, she remains a full code. She may benefit from palliative care evaluation at the skilled nursing facility, if she worsens while she is inpatient, we will definitely involve palliative care.  Telemetry (independently reviewed): Sinus rhythm  Morning labs/Imaging ordered: no  DVT Prophylaxis: Prophylactic Lovenox   Code Status: Full code   Family Communication: None at bedside  Disposition Plan: Transferred to MedSurg-potential discharge back to SNF over the weekend  Antimicrobial  agents: Anti-infectives    Start     Dose/Rate Route Frequency  Ordered Stop   01/04/17 0000  vancomycin (VANCOCIN) 1,250 mg in sodium chloride 0.9 % 250 mL IVPB     1,250 mg 166.7 mL/hr over 90 Minutes Intravenous Every 24 hours 12/16/2016 2346     01/04/17 0000  piperacillin-tazobactam (ZOSYN) IVPB 3.375 g     3.375 g 12.5 mL/hr over 240 Minutes Intravenous Every 8 hours 12/22/2016 2346     12/27/2016 2345  ceFEPIme (MAXIPIME) 2 g in dextrose 5 % 50 mL IVPB  Status:  Discontinued     2 g 100 mL/hr over 30 Minutes Intravenous  Once 12/13/2016 2333 12/28/2016 2340   12/17/2016 2345  vancomycin (VANCOCIN) IVPB 1000 mg/200 mL premix  Status:  Discontinued     1,000 mg 200 mL/hr over 60 Minutes Intravenous  Once 12/29/2016 2333 01/01/2017 2346   12/21/2016 1945  cefTRIAXone (ROCEPHIN) 1 g in dextrose 5 % 50 mL IVPB     1 g 100 mL/hr over 30 Minutes Intravenous  Once 12/07/2016 1930 01/02/2017 2105   12/22/2016 1945  azithromycin (ZITHROMAX) 500 mg in dextrose 5 % 250 mL IVPB     500 mg 250 mL/hr over 60 Minutes Intravenous  Once 12/22/2016 1930 01/04/2017 2135      Procedures: None  CONSULTS:  None  Time spent: 25 minutes-Greater than 50% of this time was spent in counseling, explanation of diagnosis, planning of further management, and coordination of care.  MEDICATIONS: Scheduled Meds: . aspirin  81 mg Oral Daily  . atorvastatin  20 mg Oral QHS  . chlorhexidine  15 mL Mouth/Throat BID  . docusate sodium  100 mg Oral Daily  . enoxaparin (LOVENOX) injection  40 mg Subcutaneous QHS  . gabapentin  300 mg Oral TID  . insulin aspart  0-15 Units Subcutaneous TID WC  . insulin glargine  18 Units Subcutaneous QHS  . ipratropium-albuterol  3 mL Nebulization TID  . lamoTRIgine  200 mg Oral QHS  . latanoprost  1 drop Both Eyes QHS  . loratadine  10 mg Oral Daily  . mouth rinse  15 mL Mouth Rinse BID  . pantoprazole  40 mg Oral Daily  . polyethylene glycol  17 g Oral Daily   Continuous Infusions: .  piperacillin-tazobactam (ZOSYN)  IV 3.375 g (01/05/17 0853)  . vancomycin Stopped (01/05/17 0451)   PRN Meds:.   PHYSICAL EXAM: Vital signs: Vitals:   01/05/17 0600 01/05/17 0700 01/05/17 0735 01/05/17 0916  BP: (!) 131/55 (!) 103/39 (!) 101/54 114/65  Pulse: 75 69 86 80  Resp: 18 10 11 17   Temp:   98 F (36.7 C)   TempSrc:   Oral   SpO2: 97% 99% 98% 90%  Weight:      Height:       Filed Weights   12/06/2016 2300 01/04/17 0350  Weight: 96.2 kg (212 lb) 94.2 kg (207 lb 11.2 oz)   Body mass index is 32.53 kg/m.   General appearance :Awake,And more alert. Eyes:, pupils equally reactive to light and accomodation,no scleral icterus. HEENT: Atraumatic and Normocephalic Neck: supple, no JVD. Resp:Good air entry bilaterall CVS: S1 S2 regular  GI: Bowel sounds present, Non tender and not distended with no gaurding, rigidity or rebound. Extremities: B/L Lower Ext shows no edema, both legs are warm to touch Neurology:  Able to move bilateral lower extremities side-by-side-but has generalized weakness-not able to evaluate further Musculoskeletal:No digital cyanosis Skin:No Rash, warm and dry Wounds:N/A  I have personally reviewed following labs and imaging studies  LABORATORY  DATA: CBC:  Recent Labs Lab 01/01/2017 1905 12/10/2016 1928 01/04/17 0159 01/05/17 0600  WBC 6.1  --  5.1 5.0  NEUTROABS 3.3  --  2.6  --   HGB 12.4 13.9 13.1 12.7  HCT 40.0 41.0 42.6 42.6  MCV 92.0  --  92.2 93.6  PLT 172  --  178 184    Basic Metabolic Panel:  Recent Labs Lab 12/24/2016 1905 01/02/2017 1928 01/04/17 0159 01/05/17 0600  NA 135 135 137 142  K 4.3 4.3 4.1 4.1  CL 93* 91* 97* 104  CO2 34*  --  30 31  GLUCOSE 165* 157* 162* 46*  BUN 19 23* 15 8  CREATININE 1.09* 1.10* 0.80 0.48  CALCIUM 9.2  --  9.0 8.5*  MG  --   --   --  1.8    GFR: Estimated Creatinine Clearance: 58.3 mL/min (by C-G formula based on SCr of 0.48 mg/dL).  Liver Function Tests:  Recent Labs Lab  12/10/2016 1905  AST 20  ALT 13*  ALKPHOS 51  BILITOT 0.7  PROT 5.8*  ALBUMIN 3.3*   No results for input(s): LIPASE, AMYLASE in the last 168 hours.  Recent Labs Lab 01/02/2017 1905  AMMONIA 17    Coagulation Profile:  Recent Labs Lab 01/02/2017 1905  INR 1.06    Cardiac Enzymes: No results for input(s): CKTOTAL, CKMB, CKMBINDEX, TROPONINI in the last 168 hours.  BNP (last 3 results) No results for input(s): PROBNP in the last 8760 hours.  HbA1C:  Recent Labs  01/02/2017 2343  HGBA1C 7.7*    CBG:  Recent Labs Lab 01/04/17 1305 01/04/17 1727 01/04/17 2217 01/05/17 0740 01/05/17 0823  GLUCAP 94 148* 98 37* 130*    Lipid Profile: No results for input(s): CHOL, HDL, LDLCALC, TRIG, CHOLHDL, LDLDIRECT in the last 72 hours.  Thyroid Function Tests:  Recent Labs  01/04/17 0946  TSH 0.429    Anemia Panel: No results for input(s): VITAMINB12, FOLATE, FERRITIN, TIBC, IRON, RETICCTPCT in the last 72 hours.  Urine analysis:    Component Value Date/Time   COLORURINE AMBER (A) 12/22/2016 1900   APPEARANCEUR TURBID (A) 12/07/2016 1900   LABSPEC 1.015 12/05/2016 1900   PHURINE 5.0 12/22/2016 1900   GLUCOSEU NEGATIVE 12/14/2016 1900   HGBUR SMALL (A) 12/29/2016 1900   BILIRUBINUR NEGATIVE 12/26/2016 1900   KETONESUR NEGATIVE 01/02/2017 1900   PROTEINUR 30 (A) 12/30/2016 1900   UROBILINOGEN 1.0 09/23/2010 1411   NITRITE NEGATIVE 12/28/2016 1900   LEUKOCYTESUR MODERATE (A) 01/02/2017 1900    Sepsis Labs: Lactic Acid, Venous    Component Value Date/Time   LATICACIDVEN 1.8 01/04/2017 0159    MICROBIOLOGY: Recent Results (from the past 240 hour(s))  Urine culture     Status: None (Preliminary result)   Collection Time: 12/05/2016  6:46 PM  Result Value Ref Range Status   Specimen Description URINE, CATHETERIZED  Final   Special Requests Normal  Final   Culture CULTURE REINCUBATED FOR BETTER GROWTH  Final   Report Status PENDING  Incomplete  Culture,  blood (Routine X 2) w Reflex to ID Panel     Status: None (Preliminary result)   Collection Time: 12/10/2016  9:17 PM  Result Value Ref Range Status   Specimen Description BLOOD LEFT HAND  Final   Special Requests   Final    BOTTLES DRAWN AEROBIC ONLY Blood Culture adequate volume   Culture NO GROWTH < 24 HOURS  Final   Report Status PENDING  Incomplete  Culture, blood (Routine X 2) w Reflex to ID Panel     Status: None (Preliminary result)   Collection Time: 09/19/16  9:23 PM  Result Value Ref Range Status   Specimen Description BLOOD LEFT HAND  Final   Special Requests IN PEDIATRIC BOTTLE Blood Culture adequate volume  Final   Culture NO GROWTH < 24 HOURS  Final   Report Status PENDING  Incomplete  MRSA PCR Screening     Status: None   Collection Time: 01/04/17 12:47 AM  Result Value Ref Range Status   MRSA by PCR NEGATIVE NEGATIVE Final    Comment:        The GeneXpert MRSA Assay (FDA approved for NASAL specimens only), is one component of a comprehensive MRSA colonization surveillance program. It is not intended to diagnose MRSA infection nor to guide or monitor treatment for MRSA infections.     RADIOLOGY STUDIES/RESULTS: Dg Chest 2 View  Result Date: 2017/07/25 CLINICAL DATA:  Altered mental status. EXAM: CHEST  2 VIEW COMPARISON:  02/03/2015 FINDINGS: There is marked cardiac enlargement. Aortic atherosclerosis noted. Diminished lung volumes. This is particularly notable involving the lateral chest radiograph. There is no pleural effusion or edema. IMPRESSION: 1. Cardiac enlargement and low lung volumes. 2.  Aortic Atherosclerosis (ICD10-I70.0). Electronically Signed   By: Signa Kellaylor  Stroud M.D.   On: 02018/12/19 18:41   Ct Head Wo Contrast  Result Date: 2017/07/25 CLINICAL DATA:  Altered mentation EXAM: CT HEAD WITHOUT CONTRAST TECHNIQUE: Contiguous axial images were obtained from the base of the skull through the vertex without intravenous contrast. COMPARISON:  04/17/2010  head CT, 08/16/2010 MRI FINDINGS: BRAIN: There is sulcal and ventricular prominence consistent with superficial and central atrophy. No intraparenchymal hemorrhage, mass effect nor midline shift. Periventricular and subcortical white matter hypodensities consistent with chronic small vessel ischemic disease are identified. No acute large vascular territory infarcts. No abnormal extra-axial fluid collections. Basal cisterns are not effaced and midline. VASCULAR: Moderate calcific atherosclerosis of the carotid siphons. SKULL: No skull fracture. No significant scalp soft tissue swelling. SINUSES/ORBITS: The mastoid air-cells are clear. The included paranasal sinuses are well-aerated.The included ocular globes and orbital contents are non-suspicious. Prior right lens surgical change. OTHER: Partial opacification of the mastoid air cells bilaterally appear IMPRESSION: 1. Atrophy with chronic small vessel ischemia. 2. No acute intracranial abnormality. 3. Small bilateral chronic mastoid effusions. Electronically Signed   By: Tollie Ethavid  Kwon M.D.   On: 02018/12/19 19:03   Ct Chest W Contrast  Result Date: 2017/07/25 CLINICAL DATA:  83 y/o  F; altered mental status, low oxygen level. EXAM: CT CHEST WITH CONTRAST TECHNIQUE: Multidetector CT imaging of the chest was performed during intravenous contrast administration. CONTRAST:  75mL ISOVUE-300 IOPAMIDOL (ISOVUE-300) INJECTION 61% COMPARISON:  02/03/2015 CT chest.  02018/12/19 chest radiograph. FINDINGS: Cardiovascular: Moderate to severe cardiomegaly. Severe coronary artery calcification. Mitral annular and aortic valvular calcification. No pericardial effusion. Normal caliber thoracic aorta with mild calcific atherosclerosis. Mediastinum/Nodes: Normal thyroid gland. No mediastinal adenopathy. There is apparent debris in the central airways, fluid levels in lobar and segmental airways, and mucous plugging in the lung bases. Lungs/Pleura: Consolidation within the left upper  lobe along the major fissure extending into the lingula and patchy opacification in dependent lower lobes bilaterally. Upper Abdomen: Subcentimeter cyst within the left kidney interpolar region. Musculoskeletal: Extensive degenerative changes of the thoracic spine. No acute osseous abnormality is identified. IMPRESSION: 1. Debris within the central airways extending into the lung bases, distribution suggest aspiration. 2. Dependent consolidation  within the left upper lobe along the major fissure and in dependent lower lobes may represent atelectasis or pneumonia. 3. Moderate to severe cardiomegaly. Coronary and aortic calcific atherosclerosis. 4. Aortic valvular and mitral annular calcification, correlate for valvular dysfunction. Electronically Signed   By: Mitzi Hansen M.D.   On: Jan 10, 2017 20:47     LOS: 2 days   Jeoffrey Massed, MD  [facility] Pager:[phone]  If 7PM-7AM, please contact night-coverage [email or web address] Password [email or web address] 01/05/2017, 10:28 AM

## 2017-01-05 NOTE — Consult Note (Signed)
Name: Ashley Fritz MRN: [id number] DOB: 04/16/1927    ADMISSION DATE:  03-22-2017 CONSULTATION DATE:  01/05/17  REFERRING MD :  Dr. Jerral RalphGhimire / [facility]  CHIEF COMPLAINT:  Desaturation   HISTORY OF PRESENT ILLNESS:  83 y/o F admitted on 5/30 from SNF with reports of altered mental status and hypoxia.    The patient was treated one week prior to admit with a z-pack and prednisone for a cough and rhinorrhea.  CXR at that time was reportedly negative.  However, on 5/30 she was found to be hypoxic with saturations in the 70's & altered mental status.  Her baseline mental status is normally alert and oriented.  On admission she was hemodynamically stable with saturations and mentation improved on nasal cannula.  The patient was admitted and treated with vancomycin and zosyn for with sepsis, UTI, and LUL pneumonia (questionably related to aspiration).  She was placed on a dysphagia 2 diet for mild oral dysphagia per recommendations by speech therapy.  She had been improving until she was found on afternoon of 6/1 confused with desaturations into the 80's which improved with Venturi mask.  Repeat CXR showed new complete whiteout of the left chest.    PCCM consulted for worsening hypoxia and new CXR findings.    PAST MEDICAL HISTORY :   has a past medical history of Acute, but ill-defined, cerebrovascular disease (01/16/2012); ANEMIA-NOS; AORTIC STENOSIS/ INSUFFICIENCY, NON-RHEUMATIC; Cataracts, bilateral; CVA (07/2008); DIABETES MELLITUS, TYPE II; GAIT DISTURBANCE; HYPERLIPIDEMIA; HYPERTENSION; LOW BACK PAIN; Muscle weakness (generalized) (11/30/2010); MYELOPATHY, CERVICAL SPINE; OSTEOARTHRITIS; and PRESSURE ULCER LOWER BACK (08/26/2010).   has a past surgical history that includes Appendectomy; Abdominal hysterectomy; Oophorectomy; and Anterior cervical decomp/discectomy fusion (09/2010).  Prior to Admission medications   Medication Sig Start Date End Date Taking? Authorizing Provider  acetaminophen  (TYLENOL) 325 MG tablet Take 650 mg by mouth 2 (two) times daily.   Yes [provider]  amLODipine (NORVASC) 10 MG tablet Take 10 mg by mouth daily.   Yes [provider]  aspirin 81 MG tablet Take 81 mg by mouth daily.     Yes [provider]  atorvastatin (LIPITOR) 20 MG tablet Take 20 mg by mouth at bedtime.    Yes [provider]  Capsaicin (CAPZASIN-HP) 0.1 % CREA Apply topically 3 (three) times daily. To bilateral knees and bilateral Extremities   Yes [provider]  cefTRIAXone (ROCEPHIN) 1 g injection Inject 1 g into the muscle once.   Yes [provider]  chlorhexidine (PERIDEX) 0.12 % solution Use as directed 15 mLs in the mouth or throat 2 (two) times daily.   Yes [provider]  Cholecalciferol (VITAMIN D) 2000 units tablet Take 2,000 Units by mouth daily.   Yes [provider]  Docusate Sodium (STOOL SOFTENER) 100 MG capsule Take 100 mg by mouth daily.    Yes [provider]  furosemide (LASIX) 20 MG tablet Take 20 mg by mouth daily.   Yes [provider]  gabapentin (NEURONTIN) 300 MG capsule Take 1 capsule (300 mg total) by mouth 3 (three) times daily. 11/13/11  Yes Newt LukesLeschber, Valerie A, MD  insulin glargine (LANTUS) 100 UNIT/ML injection Inject 36 Units into the skin at bedtime.    Yes [provider]  insulin lispro (HUMALOG) 100 UNIT/ML injection Inject 2-5 Units into the skin See admin instructions. 2 units every morning at 11:30 am, if Blood Glucose is over 90 then use 5 units instead   Yes [provider]  ipratropium-albuterol (DUONEB) 0.5-2.5 (3) MG/3ML SOLN Take 3 mLs by nebulization every 6 (six) hours.   Yes [provider]  lamoTRIgine (LAMICTAL) 200 MG tablet Take 200 mg by mouth at bedtime.   Yes [provider]  latanoprost (XALATAN) 0.005 % ophthalmic solution Place 1 drop into both eyes at bedtime.   Yes [provider]  loratadine  (CLARITIN) 10 MG tablet Take 10 mg by mouth daily.   Yes [provider]  losartan (COZAAR) 100 MG tablet Take 100 mg by mouth daily.   Yes [provider]  Menthol, Topical Analgesic, (BIOFREEZE EX) Apply topically 4 (four) times daily.   Yes [provider]  Multiple Vitamins-Minerals (CERTA-VITE PO) Take 1 tablet by mouth daily.    Yes [provider]  nitroGLYCERIN (NITROSTAT) 0.4 MG SL tablet Place 0.4 mg under the tongue every 5 (five) minutes as needed for chest pain. May repeat for 2 doses as needed for chest pain.   Yes [provider]  omeprazole (PRILOSEC) 40 MG capsule Take 40 mg by mouth daily. For reflux   Yes [provider]  polyethylene glycol (MIRALAX / GLYCOLAX) packet Take 17 g by mouth daily.   Yes [provider]  Skin Protectants, Misc. (EUCERIN) cream Apply 1 application topically at bedtime. To Legs and Feet   Yes [provider]    No Known Allergies  FAMILY HISTORY:  family history includes Alcohol abuse in her father; Diabetes in her mother.  SOCIAL HISTORY:  reports that she has never smoked. She has never used smokeless tobacco. She reports that she does not drink alcohol or use drugs.  REVIEW OF SYSTEMS:  Patient unable to answer questions.  Oriented to self, place. Otherwise keeps repeating "why don't you all just leave me alone".   SUBJECTIVE: RN reports pt gets intermittently agitated with activity and will scratch the staff.  Patient had episode of desaturation that resolved with increased O2.   VITAL SIGNS: Temp:  [98 F (36.7 C)-98.8 F (37.1 C)] 98 F (36.7 C) (06/01 0735) Pulse Rate:  [50-105] 76 (06/01 1403) Resp:  [10-27] 18 (06/01 1403) BP: (91-141)/(39-101) 99/56 (06/01 1403) SpO2:  [90 %-100 %] 99 % (06/01 1403)  PHYSICAL EXAMINATION: General: frail elderly female lying in bed, kyphotic, no acute distress HEENT: MM pink/moist, unable to assess JVD Neuro: awakens to  voice, oriented to self, place, otherwise repeats "why don't you all just leave me alone" CV: s1s2 rrr, no m/r/g PULM: even/non-labored, clear on R, diminished lower, diminished throughout on L ON:GEXB, non-tender, bsx4 active  Extremities: warm/dry, 1-2+ BLE pitting edema  Skin: no rashes or lesions    Recent Labs Lab 12/20/2016 1905 12/14/2016 1928 01/04/17 0159 01/05/17 0600  NA 135 135 137 142  K 4.3 4.3 4.1 4.1  CL 93* 91* 97* 104  CO2 34*  --  30 31  BUN 19 23* 15 8  CREATININE 1.09* 1.10* 0.80 0.48  GLUCOSE 165* 157* 162* 46*     Recent Labs Lab 12/07/2016 1905 01/02/2017 1928 01/04/17 0159 01/05/17 0600  HGB 12.4 13.9 13.1 12.7  HCT 40.0 41.0 42.6 42.6  WBC 6.1  --  5.1 5.0  PLT 172  --  178 184    Dg Chest 2 View  Result Date: 01/02/2017 CLINICAL DATA:  Altered mental status. EXAM: CHEST  2 VIEW COMPARISON:  02/03/2015 FINDINGS: There is marked cardiac enlargement. Aortic atherosclerosis noted. Diminished lung volumes. This is particularly notable involving the  lateral chest radiograph. There is no pleural effusion or edema. IMPRESSION: 1. Cardiac enlargement and low lung volumes. 2.  Aortic Atherosclerosis (ICD10-I70.0). Electronically Signed   By: Signa Kell M.D.   On: 12/20/2016 18:41   Ct Head Wo Contrast  Result Date: 12/12/2016 CLINICAL DATA:  Altered mentation EXAM: CT HEAD WITHOUT CONTRAST TECHNIQUE: Contiguous axial images were obtained from the base of the skull through the vertex without intravenous contrast. COMPARISON:  04/17/2010 head CT, 08/16/2010 MRI FINDINGS: BRAIN: There is sulcal and ventricular prominence consistent with superficial and central atrophy. No intraparenchymal hemorrhage, mass effect nor midline shift. Periventricular and subcortical white matter hypodensities consistent with chronic small vessel ischemic disease are identified. No acute large vascular territory infarcts. No abnormal extra-axial fluid collections. Basal cisterns are  not effaced and midline. VASCULAR: Moderate calcific atherosclerosis of the carotid siphons. SKULL: No skull fracture. No significant scalp soft tissue swelling. SINUSES/ORBITS: The mastoid air-cells are clear. The included paranasal sinuses are well-aerated.The included ocular globes and orbital contents are non-suspicious. Prior right lens surgical change. OTHER: Partial opacification of the mastoid air cells bilaterally appear IMPRESSION: 1. Atrophy with chronic small vessel ischemia. 2. No acute intracranial abnormality. 3. Small bilateral chronic mastoid effusions. Electronically Signed   By: Tollie Eth M.D.   On: 01/02/2017 19:03   Ct Chest W Contrast  Result Date: 12/11/2016 CLINICAL DATA:  83 y/o  F; altered mental status, low oxygen level. EXAM: CT CHEST WITH CONTRAST TECHNIQUE: Multidetector CT imaging of the chest was performed during intravenous contrast administration. CONTRAST:  75mL ISOVUE-300 IOPAMIDOL (ISOVUE-300) INJECTION 61% COMPARISON:  02/03/2015 CT chest.  12/29/2016 chest radiograph. FINDINGS: Cardiovascular: Moderate to severe cardiomegaly. Severe coronary artery calcification. Mitral annular and aortic valvular calcification. No pericardial effusion. Normal caliber thoracic aorta with mild calcific atherosclerosis. Mediastinum/Nodes: Normal thyroid gland. No mediastinal adenopathy. There is apparent debris in the central airways, fluid levels in lobar and segmental airways, and mucous plugging in the lung bases. Lungs/Pleura: Consolidation within the left upper lobe along the major fissure extending into the lingula and patchy opacification in dependent lower lobes bilaterally. Upper Abdomen: Subcentimeter cyst within the left kidney interpolar region. Musculoskeletal: Extensive degenerative changes of the thoracic spine. No acute osseous abnormality is identified. IMPRESSION: 1. Debris within the central airways extending into the lung bases, distribution suggest aspiration. 2.  Dependent consolidation within the left upper lobe along the major fissure and in dependent lower lobes may represent atelectasis or pneumonia. 3. Moderate to severe cardiomegaly. Coronary and aortic calcific atherosclerosis. 4. Aortic valvular and mitral annular calcification, correlate for valvular dysfunction. Electronically Signed   By: Mitzi Hansen M.D.   On: 12/12/2016 20:47   Dg Chest Port 1 View  Result Date: 01/05/2017 CLINICAL DATA:  Decreased oxygen saturation today. Shortness of breath. EXAM: PORTABLE CHEST 1 VIEW COMPARISON:  CT chest and PA and lateral chest 12/29/2016. FINDINGS: There is new complete whiteout of the left chest. Right lung is expanded and clear. Cardiomegaly is identified. No pneumothorax. Aortic atherosclerosis is noted. IMPRESSION: New complete whiteout of the left chest consistent with diffuse airspace disease and possibly a pleural effusion. Cardiomegaly without edema. Atherosclerosis. Electronically Signed   By: Drusilla Kanner M.D.   On: 01/05/2017 13:54      SIGNIFICANT EVENTS  5/30  Admit with AMS, hypoxia > concern for LLL PNA, UTI  STUDIES:  CT Head 5/30 >> Atrophy with chronic small vessel ischemia, no acute abnormality, stable bilateral chronic mastoid effusions CT Chest 5/30 >>  debris within the central airways extending into the lung bases concerning for aspiration, dependent consolidation in the LUL, moderate to severe cardiomegaly, aortic/mitral valve calcification  ECHO 6/1 >> LV normal size, moderate LVH, LVEF ~ 40%, unable to estimate diastolic function due to AF   ASSESSMENT / PLAN:  Discussion:  83 y/o F admitted 5/30 with AMS and hypoxia.  Work up concerning for LLL PNA and UTI.  She has had recurrent episodes of hypoxia (true vs false readings as pt has been pulling off monitor) and concern of aspiration.    1. Acute Hypoxic Respiratory Failure - in setting of frail elderly, suspected aspiration PNA, poor secretion clearance and  overall deconditioning of advanced age / chronic illness.   2. Left PNA with Mucus Plugging 3. Dysphagia  4. Deconditioning of Chronic Illness 5. Hx CVA  Plan: Pulmonary hygiene as tolerated- chest vest, mucomyst, NTS PRN Intermittent CXR  Mobilize as able - upright positioning, frequent turning  Position L side up to facilitate drainage  Continue Zosyn per pharmacy, D3/x  NPO SLP evaluation recommended finely chopped diet with thin liquids (would not feed unless she is alert)   Global - Per prior discussion, she remains a full code.  Would not recommend CPR or mechanical ventilation given overall state of health.   Aspiration will likely be a recurring event given weakness, kyphosis and prior CVA.   Additionally, BiPAP would not be ideal due to altered mental status and high risk for aspiration.  Hopefully, we can turn her around with aggressive pulmonary hygiene.     Canary Brim, NP-C [facility] Pgr: [phone] or if no answer [phone] 01/05/2017, 3:01 PM

## 2017-01-05 NOTE — Progress Notes (Signed)
(  Late entry) Informed by RN around 12 PM that patient had a brief episode of desaturation down to the 70s briefly requiring Ventimask. Upon my evaluation-patient slightly confused-but was transitioned to 3-4 L of oxygen via nasal cannula.   Difficult exam-she really does not follow commands  Lungs:decreased air entry on the left CVS:S1-S2-regular Abdomen: Soft and nontender Extremities: No edema  Chest x-ray (ordered stat)-left lung whiteout  Impression: Acute hypoxic respiratory failure: Secondary to left lung whiteout probably due to mucous plugging  Plan: Start Mucomyst nebs-not sure if the patient can tolerate chest physiotherapy/vibrator vest Continue antibiotics  PCCM consulted Tried calling patient's healthcare power of attorney-left voicemail Very frail-poor overall prognoses-not a great candidate for aggressive care-have consulted palliative care as well.  Total time spent 45 minutes  Time in 12:00 pm Time out 12:45 pm

## 2017-01-05 NOTE — Progress Notes (Signed)
Informed Dr. Jerral RalphGhimire that patient was Destating into the low 80's so 12 liter Venti mask was placed. Informed MD that patient is now combative and left lung is significantly more diminished than the second. MD to Order stat chest X-ray and comesee patient.

## 2017-01-05 NOTE — Progress Notes (Signed)
Hypoglycemic Event  CBG: 31 at 2159  Treatment: D50 IV 50 mL  Symptoms: None  Follow-up CBG: Time: 2215 CBG Result:120  Possible Reasons for Event: Inadequate meal intake  Comments/MD notified:   Pt asymptomatic at this time. Will continue to monitor.  Joycelyn ManHimes, Shiza Thelen M

## 2017-01-05 DEATH — deceased

## 2017-01-06 ENCOUNTER — Inpatient Hospital Stay (HOSPITAL_COMMUNITY): Payer: Medicare Other

## 2017-01-06 DIAGNOSIS — G9341 Metabolic encephalopathy: Secondary | ICD-10-CM

## 2017-01-06 DIAGNOSIS — J189 Pneumonia, unspecified organism: Secondary | ICD-10-CM

## 2017-01-06 DIAGNOSIS — J9601 Acute respiratory failure with hypoxia: Secondary | ICD-10-CM

## 2017-01-06 DIAGNOSIS — J690 Pneumonitis due to inhalation of food and vomit: Secondary | ICD-10-CM

## 2017-01-06 DIAGNOSIS — T383X5A Adverse effect of insulin and oral hypoglycemic [antidiabetic] drugs, initial encounter: Secondary | ICD-10-CM

## 2017-01-06 DIAGNOSIS — E160 Drug-induced hypoglycemia without coma: Secondary | ICD-10-CM

## 2017-01-06 DIAGNOSIS — J9811 Atelectasis: Secondary | ICD-10-CM

## 2017-01-06 DIAGNOSIS — N179 Acute kidney failure, unspecified: Secondary | ICD-10-CM

## 2017-01-06 LAB — GLUCOSE, CAPILLARY
GLUCOSE-CAPILLARY: 144 mg/dL — AB (ref 65–99)
Glucose-Capillary: 100 mg/dL — ABNORMAL HIGH (ref 65–99)
Glucose-Capillary: 59 mg/dL — ABNORMAL LOW (ref 65–99)
Glucose-Capillary: 91 mg/dL (ref 65–99)
Glucose-Capillary: 99 mg/dL (ref 65–99)

## 2017-01-06 LAB — CBC
HCT: 44.2 % (ref 36.0–46.0)
HEMOGLOBIN: 13 g/dL (ref 12.0–15.0)
MCH: 28.1 pg (ref 26.0–34.0)
MCHC: 29.4 g/dL — ABNORMAL LOW (ref 30.0–36.0)
MCV: 95.5 fL (ref 78.0–100.0)
Platelets: 188 10*3/uL (ref 150–400)
RBC: 4.63 MIL/uL (ref 3.87–5.11)
RDW: 17.3 % — ABNORMAL HIGH (ref 11.5–15.5)
WBC: 4.8 10*3/uL (ref 4.0–10.5)

## 2017-01-06 LAB — COMPREHENSIVE METABOLIC PANEL
ALK PHOS: 57 U/L (ref 38–126)
ALT: 16 U/L (ref 14–54)
AST: 21 U/L (ref 15–41)
Albumin: 3 g/dL — ABNORMAL LOW (ref 3.5–5.0)
Anion gap: 8 (ref 5–15)
BUN: 5 mg/dL — AB (ref 6–20)
CO2: 32 mmol/L (ref 22–32)
CREATININE: 0.52 mg/dL (ref 0.44–1.00)
Calcium: 8.7 mg/dL — ABNORMAL LOW (ref 8.9–10.3)
Chloride: 101 mmol/L (ref 101–111)
GFR calc non Af Amer: >60 mL/min (ref >60)
GLUCOSE: 114 mg/dL — AB (ref 65–99)
Potassium: 3.8 mmol/L (ref 3.5–5.1)
SODIUM: 141 mmol/L (ref 135–145)
Total Bilirubin: 0.7 mg/dL (ref 0.3–1.2)
Total Protein: 6.4 g/dL — ABNORMAL LOW (ref 6.5–8.1)

## 2017-01-06 LAB — PROCALCITONIN

## 2017-01-06 MED ORDER — POLYVINYL ALCOHOL 1.4 % OP SOLN: 1.0000 [drp] | Freq: Four times a day (QID) | OPHTHALMIC | Status: DC | PRN

## 2017-01-06 MED ORDER — BIOTENE DRY MOUTH MT LIQD: 15.0000 mL | OROMUCOSAL | Status: DC | PRN

## 2017-01-06 MED ORDER — MORPHINE BOLUS VIA INFUSION
1.0000 mg | INTRAVENOUS | Status: DC | PRN
  Administered 2017-01-06: 2 mg via INTRAVENOUS
  Filled 2017-01-06: qty 2

## 2017-01-06 MED ORDER — ACETAMINOPHEN 325 MG PO TABS: 650.0000 mg | ORAL_TABLET | Freq: Four times a day (QID) | ORAL | Status: DC | PRN

## 2017-01-06 MED ORDER — LORAZEPAM 2 MG/ML IJ SOLN
0.5000 mg | Freq: Four times a day (QID) | INTRAMUSCULAR | Status: DC | PRN
  Administered 2017-01-06: 0.5 mg via INTRAVENOUS
  Filled 2017-01-06: qty 1

## 2017-01-06 MED ORDER — HALOPERIDOL 0.5 MG PO TABS
0.5000 mg | ORAL_TABLET | ORAL | Status: DC | PRN
  Filled 2017-01-06: qty 1

## 2017-01-06 MED ORDER — HALOPERIDOL LACTATE 5 MG/ML IJ SOLN: 0.5000 mg | INTRAMUSCULAR | Status: DC | PRN

## 2017-01-06 MED ORDER — SODIUM CHLORIDE 0.9 % IV SOLN
1.0000 mg/h | INTRAVENOUS | Status: DC
  Administered 2017-01-06: 1 mg/h via INTRAVENOUS
  Filled 2017-01-06: qty 5
  Filled 2017-01-06: qty 10

## 2017-01-06 MED ORDER — FLUMAZENIL 0.5 MG/5ML IV SOLN: 0.5000 mg | Freq: Once | INTRAVENOUS | Status: DC

## 2017-01-06 MED ORDER — GLYCOPYRROLATE 0.2 MG/ML IJ SOLN: 0.2000 mg | INTRAMUSCULAR | Status: DC | PRN

## 2017-01-06 MED ORDER — HALOPERIDOL LACTATE 2 MG/ML PO CONC
0.5000 mg | ORAL | Status: DC | PRN
  Filled 2017-01-06: qty 0.3

## 2017-01-06 MED ORDER — ACETAMINOPHEN 650 MG RE SUPP: 650.0000 mg | Freq: Four times a day (QID) | RECTAL | Status: DC | PRN

## 2017-01-06 MED ORDER — DEXTROSE 5 % IV SOLN
INTRAVENOUS | Status: DC
  Administered 2017-01-06: 05:00:00 via INTRAVENOUS

## 2017-01-06 MED ORDER — LORAZEPAM 1 MG PO TABS: 1.0000 mg | ORAL_TABLET | ORAL | Status: DC | PRN

## 2017-01-06 MED ORDER — SODIUM CHLORIDE 0.9% FLUSH: 3.0000 mL | INTRAVENOUS | Status: DC | PRN

## 2017-01-06 MED ORDER — LORAZEPAM 2 MG/ML IJ SOLN: 1.0000 mg | INTRAMUSCULAR | Status: DC | PRN

## 2017-01-06 MED ORDER — GLYCOPYRROLATE 1 MG PO TABS
1.0000 mg | ORAL_TABLET | ORAL | Status: DC | PRN
  Filled 2017-01-06: qty 1

## 2017-01-06 MED ORDER — ONDANSETRON 4 MG PO TBDP: 4.0000 mg | ORAL_TABLET | Freq: Four times a day (QID) | ORAL | Status: DC | PRN

## 2017-01-06 MED ORDER — DEXTROSE 50 % IV SOLN
INTRAVENOUS | Status: AC
  Administered 2017-01-06: 05:00:00
  Filled 2017-01-06: qty 50

## 2017-01-06 MED ORDER — LORAZEPAM 2 MG/ML PO CONC: 1.0000 mg | ORAL | Status: DC | PRN

## 2017-01-06 MED ORDER — FLUMAZENIL 0.5 MG/5ML IV SOLN
INTRAVENOUS | Status: AC
  Administered 2017-01-06: 0.5 mg
  Filled 2017-01-06: qty 5

## 2017-01-06 MED ORDER — SODIUM CHLORIDE 0.9% FLUSH
3.0000 mL | Freq: Two times a day (BID) | INTRAVENOUS | Status: DC
  Administered 2017-01-07: 3 mL via INTRAVENOUS

## 2017-01-06 MED ORDER — SODIUM CHLORIDE 0.9 % IV SOLN: 250.0000 mL | INTRAVENOUS | Status: DC | PRN

## 2017-01-06 MED ORDER — SODIUM CHLORIDE 0.9 % IV SOLN
12.5000 mg | Freq: Four times a day (QID) | INTRAVENOUS | Status: DC | PRN
  Filled 2017-01-06: qty 0.5

## 2017-01-06 MED ORDER — ONDANSETRON HCL 4 MG/2ML IJ SOLN: 4.0000 mg | Freq: Four times a day (QID) | INTRAMUSCULAR | Status: DC | PRN

## 2017-01-06 MED ORDER — ALBUTEROL SULFATE (2.5 MG/3ML) 0.083% IN NEBU: 2.5000 mg | INHALATION_SOLUTION | RESPIRATORY_TRACT | Status: DC | PRN

## 2017-01-06 NOTE — Progress Notes (Signed)
PROGRESS NOTE        PATIENT DETAILS Name: Ashley Fritz Age: 83 y.o. Sex: female Date of Birth: [birth year] Admit Date: 12/15/2016 Admitting Physician Jonah Blue, MD ZOX:[id number], No Pcp Per  Brief Narrative: Patient is a 83 y.o. female with past medical history of CVA, diabetes, hypertension and dyslipidemia transferred from skilled nursing facility for evaluation of hypoxia, found to have hypothermia and clinical features consistent with healthcare associated pneumonia, she was subsequently admitted to the [facility] for further evaluation and treatment. Please see below for further details   Subjective: Confused-much more weaker than yesterday-on 3-4 L of oxygen.  Assessment/Plan: Acute hypoxic respiratory failure due to mucous plugging/aspiration pneumonia with left lung whiteout: Continues to slowly deteriorate-confused-still hypoxic-at-risk for further deterioration, palliative care planning on family meeting later today. In the meantime, continue with oxygen supplementation, empiric antibiotics, Mucomyst nebulizers, chest physiotherapy-but I suspect she would best benefit from transition to comfort measures. Await further input from palliative care team, appreciate PCCM input as well. May need to keep BiPAP at bedside  Acute metabolic encephalopathy: Suspect from hypoxia-supportive care-cautious use of Ativan if she is very agitated. See above regarding hypoxic respiratory failure.  Aspiration pneumonia: Continue Zosyn-unfortunately continues to have a mucous plugging/aspiration in spite of maximal therapy with antibiotics and other supportive measures. Nothing by mouth until family discussion later today to delineate goals of care.  Acute kidney injury: Resolved, likely hemodynamically mediated in a setting of sepsis. L  Atrial fibrillation: Seen on EKG on admission-has spontaneously converted back to sinus rhythm. Not on any rate  controlling agents-.Chadsvasc score of at least 5. Clearly not a candidate for long-term anticoagulation advanced age-frailty-increased fall risk-not a great long-term anti-coagulation candidate. Transthoracic echo shows EF of around 40%.  Chronic systolic heart failure: Volume status seems to be reasonable-echo on 6/1 showed EF around 40%.  Insulin-dependent type 2 diabetes with hypoglycemia: Hypoglycemic multiple times last night-stop Lantus-continue SSI. Follow and adjust accordingly.  Hypertension: Controlled-continue to hold antihypertensives for now-follow and resume accordingly.  Generalized weakness/deconditioning: Her generalized weakness has significantly worsened-I suspect this is secondary to acute blood loss. Per family at baseline she is able to feed herself but is mostly bed to wheelchair bound.   History of cervical spine myelopathy/gait disturbance: Very weak-confused-at baseline nonambulatory-bed to wheelchair bound.  Prior history of CVA: Continue aspirin and statin-see above regarding generalized weakness and atrial fibrillation  Goals of care: Her overall prognosis is very poor-slowly deteriorating-and at risk for further worsening and inpatient that. Agree with pulmonary critical care that patient best served by initiation of hospice measures/Comfort Care-Place on a ventilator of performing CPR probably will not prolong her life. Await further delineation of goals of care by the palliative care team-family meeting is scheduled for later today. Ativan as needed ordered keep her comfortable after speaking with the palliative care team.  Telemetry (independently reviewed): Some PVCs-artifacts-bradycardic at times.  Morning labs/Imaging ordered: yes  DVT Prophylaxis: Prophylactic Lovenox   Code Status: Full code   Family Communication: None at bedside-palliative care planning on family meeting later today  Disposition Plan: Remain in stepdown  Antimicrobial  agents: Anti-infectives    Start     Dose/Rate Route Frequency Ordered Stop   01/04/17 0000  vancomycin (VANCOCIN) 1,250 mg in sodium chloride 0.9 % 250 mL IVPB  Status:  Discontinued  1,250 mg 166.7 mL/hr over 90 Minutes Intravenous Every 24 hours 02/24/17 2346 01/05/17 1038   01/04/17 0000  piperacillin-tazobactam (ZOSYN) IVPB 3.375 g     3.375 g 12.5 mL/hr over 240 Minutes Intravenous Every 8 hours 02/24/17 2346     02/24/17 2345  ceFEPIme (MAXIPIME) 2 g in dextrose 5 % 50 mL IVPB  Status:  Discontinued     2 g 100 mL/hr over 30 Minutes Intravenous  Once 02/24/17 2333 02/24/17 2340   02/24/17 2345  vancomycin (VANCOCIN) IVPB 1000 mg/200 mL premix  Status:  Discontinued     1,000 mg 200 mL/hr over 60 Minutes Intravenous  Once 02/24/17 2333 02/24/17 2346   02/24/17 1945  cefTRIAXone (ROCEPHIN) 1 g in dextrose 5 % 50 mL IVPB     1 g 100 mL/hr over 30 Minutes Intravenous  Once 02/24/17 1930 02/24/17 2105   02/24/17 1945  azithromycin (ZITHROMAX) 500 mg in dextrose 5 % 250 mL IVPB     500 mg 250 mL/hr over 60 Minutes Intravenous  Once 02/24/17 1930 02/24/17 2135      Procedures: None  CONSULTS:  None  Time spent: 40 minutes-Greater than 50% of this time was spent in counseling, explanation of diagnosis, planning of further management, and coordination of care.  MEDICATIONS: Scheduled Meds: . acetylcysteine  4 mL Nebulization TID  . aspirin  81 mg Oral Daily  . atorvastatin  20 mg Oral QHS  . chlorhexidine  15 mL Mouth/Throat BID  . docusate sodium  100 mg Oral Daily  . enoxaparin (LOVENOX) injection  40 mg Subcutaneous QHS  . gabapentin  300 mg Oral TID  . insulin aspart  0-15 Units Subcutaneous TID WC  . ipratropium-albuterol  3 mL Nebulization TID  . lamoTRIgine  200 mg Oral QHS  . latanoprost  1 drop Both Eyes QHS  . loratadine  10 mg Oral Daily  . mouth rinse  15 mL Mouth Rinse BID  . pantoprazole  40 mg Oral Daily  . polyethylene glycol  17 g Oral Daily    Continuous Infusions: . dextrose 10 mL/hr at 01/06/17 0441  . piperacillin-tazobactam (ZOSYN)  IV Stopped (01/06/17 0338)   PRN Meds:.   PHYSICAL EXAM: Vital signs: Vitals:   01/06/17 0300 01/06/17 0400 01/06/17 0500 01/06/17 0711  BP: (!) 150/73 (!) 142/67 (!) 118/101 (!) 150/100  Pulse: 92 79 90 85  Resp: (!) 25 (!) 22 (!) 22 18  Temp: 99.5 F (37.5 C)   98.8 F (37.1 C)  TempSrc: Oral   Oral  SpO2: 97% 100% 97% 95%  Weight:      Height:       Filed Weights   02/24/17 2300 01/04/17 0350  Weight: 96.2 kg (212 lb) 94.2 kg (207 lb 11.2 oz)   Body mass index is 32.53 kg/m.   General appearance :Awake,Confused-in slight distress-attempting to pull out her mittens Eyes:, pupils equally reactive to light and accomodation HEENT: Atraumatic and Normocephalic Neck: supple, no JVD. Resp: Hardly any air entry on the left side-some air entry in the right side-difficult exam due to patient mental status CVS: S1 S2 regular GI: Bowel sounds present, Non tender and not distended with no gaurding, rigidity or rebound. Extremities: B/L Lower Ext shows no edema, both legs are warm to touch Neurology:  Seems to be moving all 4 extremities-but appears to be very weak Psychiatric: Confused-unable to evaluate Musculoskeletal:No digital cyanosis Skin:No Rash, warm and dry  I have personally reviewed following labs and  imaging studies  LABORATORY DATA: CBC:  Recent Labs Lab 01/02/2017 1905 01/04/2017 1928 01/04/17 0159 01/05/17 0600 01/06/17 0659  WBC 6.1  --  5.1 5.0 4.8  NEUTROABS 3.3  --  2.6  --   --   HGB 12.4 13.9 13.1 12.7 13.0  HCT 40.0 41.0 42.6 42.6 44.2  MCV 92.0  --  92.2 93.6 95.5  PLT 172  --  178 184 188    Basic Metabolic Panel:  Recent Labs Lab 12/18/2016 1905 12/27/2016 1928 01/04/17 0159 01/05/17 0600 01/06/17 0659  NA 135 135 137 142 141  K 4.3 4.3 4.1 4.1 3.8  CL 93* 91* 97* 104 101  CO2 34*  --  30 31 32  GLUCOSE 165* 157* 162* 46* 114*  BUN 19  23* 15 8 5*  CREATININE 1.09* 1.10* 0.80 0.48 0.52  CALCIUM 9.2  --  9.0 8.5* 8.7*  MG  --   --   --  1.8  --     GFR: Estimated Creatinine Clearance: 58.3 mL/min (by C-G formula based on SCr of 0.52 mg/dL).  Liver Function Tests:  Recent Labs Lab 12/16/2016 1905 01/06/17 0659  AST 20 21  ALT 13* 16  ALKPHOS 51 57  BILITOT 0.7 0.7  PROT 5.8* 6.4*  ALBUMIN 3.3* 3.0*   No results for input(s): LIPASE, AMYLASE in the last 168 hours.  Recent Labs Lab 12/25/2016 1905  AMMONIA 17    Coagulation Profile:  Recent Labs Lab 12/22/2016 1905  INR 1.06    Cardiac Enzymes: No results for input(s): CKTOTAL, CKMB, CKMBINDEX, TROPONINI in the last 168 hours.  BNP (last 3 results) No results for input(s): PROBNP in the last 8760 hours.  HbA1C:  Recent Labs  12/15/2016 2343  HGBA1C 7.7*    CBG:  Recent Labs Lab 01/05/17 2159 01/06/17 0037 01/06/17 0421 01/06/17 0459 01/06/17 0710  GLUCAP 31* 100* 59* 144* 91    Lipid Profile: No results for input(s): CHOL, HDL, LDLCALC, TRIG, CHOLHDL, LDLDIRECT in the last 72 hours.  Thyroid Function Tests:  Recent Labs  01/04/17 0946  TSH 0.429    Anemia Panel: No results for input(s): VITAMINB12, FOLATE, FERRITIN, TIBC, IRON, RETICCTPCT in the last 72 hours.  Urine analysis:    Component Value Date/Time   COLORURINE AMBER (A) 12/20/2016 1900   APPEARANCEUR TURBID (A) 12/31/2016 1900   LABSPEC 1.015 12/16/2016 1900   PHURINE 5.0 12/08/2016 1900   GLUCOSEU NEGATIVE 12/10/2016 1900   HGBUR SMALL (A) 12/05/2016 1900   BILIRUBINUR NEGATIVE 12/29/2016 1900   KETONESUR NEGATIVE 12/24/2016 1900   PROTEINUR 30 (A) 01/04/2017 1900   UROBILINOGEN 1.0 09/23/2010 1411   NITRITE NEGATIVE 12/23/2016 1900   LEUKOCYTESUR MODERATE (A) 01/04/2017 1900    Sepsis Labs: Lactic Acid, Venous    Component Value Date/Time   LATICACIDVEN 1.8 01/04/2017 0159    MICROBIOLOGY: Recent Results (from the past 240 hour(s))  Urine culture      Status: None (Preliminary result)   Collection Time: 12/24/2016  6:46 PM  Result Value Ref Range Status   Specimen Description URINE, CATHETERIZED  Final   Special Requests Normal  Final   Culture CULTURE REINCUBATED FOR BETTER GROWTH  Final   Report Status PENDING  Incomplete  Culture, blood (Routine X 2) w Reflex to ID Panel     Status: None (Preliminary result)   Collection Time: 12/24/2016  9:17 PM  Result Value Ref Range Status   Specimen Description BLOOD LEFT HAND  Final  Special Requests   Final    BOTTLES DRAWN AEROBIC ONLY Blood Culture adequate volume   Culture NO GROWTH 2 DAYS  Final   Report Status PENDING  Incomplete  Culture, blood (Routine X 2) w Reflex to ID Panel     Status: None (Preliminary result)   Collection Time: 2017/01/04  9:23 PM  Result Value Ref Range Status   Specimen Description BLOOD LEFT HAND  Final   Special Requests IN PEDIATRIC BOTTLE Blood Culture adequate volume  Final   Culture NO GROWTH 2 DAYS  Final   Report Status PENDING  Incomplete  MRSA PCR Screening     Status: None   Collection Time: 01/04/17 12:47 AM  Result Value Ref Range Status   MRSA by PCR NEGATIVE NEGATIVE Final    Comment:        The GeneXpert MRSA Assay (FDA approved for NASAL specimens only), is one component of a comprehensive MRSA colonization surveillance program. It is not intended to diagnose MRSA infection nor to guide or monitor treatment for MRSA infections.     RADIOLOGY STUDIES/RESULTS: Dg Chest 2 View  Result Date: 01/04/17 CLINICAL DATA:  Altered mental status. EXAM: CHEST  2 VIEW COMPARISON:  02/03/2015 FINDINGS: There is marked cardiac enlargement. Aortic atherosclerosis noted. Diminished lung volumes. This is particularly notable involving the lateral chest radiograph. There is no pleural effusion or edema. IMPRESSION: 1. Cardiac enlargement and low lung volumes. 2.  Aortic Atherosclerosis (ICD10-I70.0). Electronically Signed   By: Signa Kell M.D.    On: 01-04-2017 18:41   Ct Head Wo Contrast  Result Date: 01/04/2017 CLINICAL DATA:  Altered mentation EXAM: CT HEAD WITHOUT CONTRAST TECHNIQUE: Contiguous axial images were obtained from the base of the skull through the vertex without intravenous contrast. COMPARISON:  04/17/2010 head CT, 08/16/2010 MRI FINDINGS: BRAIN: There is sulcal and ventricular prominence consistent with superficial and central atrophy. No intraparenchymal hemorrhage, mass effect nor midline shift. Periventricular and subcortical white matter hypodensities consistent with chronic small vessel ischemic disease are identified. No acute large vascular territory infarcts. No abnormal extra-axial fluid collections. Basal cisterns are not effaced and midline. VASCULAR: Moderate calcific atherosclerosis of the carotid siphons. SKULL: No skull fracture. No significant scalp soft tissue swelling. SINUSES/ORBITS: The mastoid air-cells are clear. The included paranasal sinuses are well-aerated.The included ocular globes and orbital contents are non-suspicious. Prior right lens surgical change. OTHER: Partial opacification of the mastoid air cells bilaterally appear IMPRESSION: 1. Atrophy with chronic small vessel ischemia. 2. No acute intracranial abnormality. 3. Small bilateral chronic mastoid effusions. Electronically Signed   By: Tollie Eth M.D.   On: 01-04-17 19:03   Ct Chest W Contrast  Result Date: Jan 04, 2017 CLINICAL DATA:  83 y/o  F; altered mental status, low oxygen level. EXAM: CT CHEST WITH CONTRAST TECHNIQUE: Multidetector CT imaging of the chest was performed during intravenous contrast administration. CONTRAST:  75mL ISOVUE-300 IOPAMIDOL (ISOVUE-300) INJECTION 61% COMPARISON:  02/03/2015 CT chest.  01/04/17 chest radiograph. FINDINGS: Cardiovascular: Moderate to severe cardiomegaly. Severe coronary artery calcification. Mitral annular and aortic valvular calcification. No pericardial effusion. Normal caliber thoracic aorta  with mild calcific atherosclerosis. Mediastinum/Nodes: Normal thyroid gland. No mediastinal adenopathy. There is apparent debris in the central airways, fluid levels in lobar and segmental airways, and mucous plugging in the lung bases. Lungs/Pleura: Consolidation within the left upper lobe along the major fissure extending into the lingula and patchy opacification in dependent lower lobes bilaterally. Upper Abdomen: Subcentimeter cyst within the left kidney interpolar region.  Musculoskeletal: Extensive degenerative changes of the thoracic spine. No acute osseous abnormality is identified. IMPRESSION: 1. Debris within the central airways extending into the lung bases, distribution suggest aspiration. 2. Dependent consolidation within the left upper lobe along the major fissure and in dependent lower lobes may represent atelectasis or pneumonia. 3. Moderate to severe cardiomegaly. Coronary and aortic calcific atherosclerosis. 4. Aortic valvular and mitral annular calcification, correlate for valvular dysfunction. Electronically Signed   By: Mitzi Hansen M.D.   On: Jan 27, 2017 20:47   Dg Chest Port 1 View  Result Date: 01/06/2017 CLINICAL DATA:  Followup shortness of breath. EXAM: PORTABLE CHEST 1 VIEW COMPARISON:  01/05/2017 and previous. FINDINGS: Continued complete opacification of the left hemithorax consistent with left lung atelectasis, possibly with some pleural fluid associated. Some worsening of patchy infiltrate in the right lower lobe. IMPRESSION: Continued collapse/ consolidation of the left lung. Developing patchy infiltrate in the right lower lobe. Electronically Signed   By: Paulina Fusi M.D.   On: 01/06/2017 07:55   Dg Chest Port 1 View  Result Date: 01/05/2017 CLINICAL DATA:  Decreased oxygen saturation today. Shortness of breath. EXAM: PORTABLE CHEST 1 VIEW COMPARISON:  CT chest and PA and lateral chest 01/27/2017. FINDINGS: There is new complete whiteout of the left chest. Right  lung is expanded and clear. Cardiomegaly is identified. No pneumothorax. Aortic atherosclerosis is noted. IMPRESSION: New complete whiteout of the left chest consistent with diffuse airspace disease and possibly a pleural effusion. Cardiomegaly without edema. Atherosclerosis. Electronically Signed   By: Drusilla Kanner M.D.   On: 01/05/2017 13:54     LOS: 3 days   Jeoffrey Massed, MD  [facility] Pager:[phone]  If 7PM-7AM, please contact night-coverage [email or web address] Password [email or web address] 01/06/2017, 8:09 AM

## 2017-01-06 NOTE — Progress Notes (Addendum)
Was reported that pt was not co-operative with treatment and coughing.  Holding mucomyst this treatment to evaluate coughing.  Pt was fighting me when put mask on.  She was able to grab mask and get medicine out of chamber.  I tried doing CPT with pt.  Got it hooked up and started.  She kept pulling tubes out of machine so it would not work.  Complaining all the time to stop.

## 2017-01-06 NOTE — Progress Notes (Signed)
Hypoglycemic Event  CBG: 59 at 0421  Treatment: D50 IV 50 mL 0437  Symptoms: None  Follow-up CBG: Time: 0458 CBG Result: 144  Possible Reasons for Event: Inadequate meal intake  Comments/MD notified: M. Lynch notified of second hypoglycemic event for the night. RN asked for continuous dextrose infusion to help maintain blood sugars, esp since pt is NPO. Orders placed and IVF started. Will continue to monitor.    Joycelyn ManHimes, Shade Kaley M

## 2017-01-06 NOTE — Progress Notes (Signed)
Daily Progress Note   Patient Name: Ashley Fritz       Date: 01/06/2017 DOB: 11/17/1926  Age: 83 y.o. MRN#: [id number] Attending Physician: Maretta BeesGhimire, Shanker M, MD Primary Care Physician: Patient, No Pcp Per Admit Date: May 16, 2017  Reason for Consultation/Follow-up: Establishing goals of care, Non pain symptom management and Psychosocial/spiritual support  Subjective: Pt agitated, pulling lines, taking 02 off; stating "you are trying to kill me". As per my conversation yesterday with pt's niece who is her next of kin, I called her at 10am to discuss code status and clinical condition.  Niece struggling with advance care decisions specifically code status as she is in [place] and has never had these conversations with her aunt. She stated she wanted to discuss this with her sister, and some of her aunt's friends. She asked if she could get back to me in 24-48 hours. I informed her thst at this time her aunt is very sick and could find herself on life support at any time  In the interim, I order ativan 0.5mg  for acute agitation. MD paged me and pt de-sated, rapid response was called and romazicon was adm, pt placed on bipap. Dr. Jerral RalphGhimire in the interim also trying to reach niece as to clinical changes and she asked that she speak to her sister before making a decision.Pt critically ill, minimally responsive and still on bipap  Length of Stay: 3  Current Medications: Scheduled Meds:  . acetylcysteine  4 mL Nebulization TID  . aspirin  81 mg Oral Daily  . atorvastatin  20 mg Oral QHS  . chlorhexidine  15 mL Mouth/Throat BID  . docusate sodium  100 mg Oral Daily  . enoxaparin (LOVENOX) injection  40 mg Subcutaneous QHS  . gabapentin  300 mg Oral TID  . insulin aspart  0-15 Units Subcutaneous TID  WC  . ipratropium-albuterol  3 mL Nebulization TID  . lamoTRIgine  200 mg Oral QHS  . latanoprost  1 drop Both Eyes QHS  . loratadine  10 mg Oral Daily  . mouth rinse  15 mL Mouth Rinse BID  . pantoprazole  40 mg Oral Daily  . polyethylene glycol  17 g Oral Daily    Continuous Infusions: . dextrose 10 mL/hr at 01/06/17 0441  . piperacillin-tazobactam (ZOSYN)  IV 3.375 g (01/06/17 0851)  PRN Meds: acetaminophen, LORazepam, MUSCLE RUB  Physical Exam  Constitutional: She appears well-nourished.  Acutely ill appearing female; agitated, in distress  HENT:  Head: Normocephalic and atraumatic.  Cardiovascular:  tachycardic  Pulmonary/Chest:  Increased work of breathing  Neurological: She is alert.  Skin: Skin is warm and dry.  Psychiatric:  Agitated, confused  Nursing note and vitals reviewed.           Vital Signs: BP (!) 153/64   Pulse 97   Temp 98.8 F (37.1 C) (Oral)   Resp (!) 22   Ht 5\' 7"  (1.702 m)   Wt 94.2 kg (207 lb 11.2 oz)   SpO2 98%   BMI 32.53 kg/m  SpO2: SpO2: 98 % O2 Device: O2 Device: Nasal Cannula O2 Flow Rate: O2 Flow Rate (L/min): 3 L/min  Intake/output summary:  Intake/Output Summary (Last 24 hours) at 01/06/17 1150 Last data filed at 01/06/17 0500  Gross per 24 hour  Intake              100 ml  Output              400 ml  Net             -300 ml   LBM: Last BM Date: 01/04/17 Baseline Weight: Weight: 96.2 kg (212 lb) Most recent weight: Weight: 94.2 kg (207 lb 11.2 oz)       Palliative Assessment/Data:    Flowsheet Rows     Most Recent Value  Intake Tab  Referral Department  Hospitalist  Unit at Time of Referral  Intermediate Care Unit  Palliative Care Primary Diagnosis  Sepsis/Infectious Disease  Date Notified  01/05/17  Palliative Care Type  New Palliative care  Reason for referral  Clarify Goals of Care  Date of Admission  12/21/2016  Date first seen by Palliative Care  01/05/17  # of days Palliative referral response time   0 Day(s)  # of days IP prior to Palliative referral  2  Clinical Assessment  Palliative Performance Scale Score  30%  Pain Max last 24 hours  Not able to report  Pain Min Last 24 hours  Not able to report  Dyspnea Max Last 24 Hours  Not able to report  Dyspnea Min Last 24 hours  Not able to report  Nausea Max Last 24 Hours  Not able to report  Nausea Min Last 24 Hours  Not able to report  Anxiety Max Last 24 Hours  Not able to report  Anxiety Min Last 24 Hours  Not able to report  Other Max Last 24 Hours  Not able to report  Psychosocial & Spiritual Assessment  Palliative Care Outcomes  Patient/Family meeting held?  Yes  Who was at the meeting?  pt and pt's niece via phone  Palliative Care follow-up planned  Yes, Facility      Patient Active Problem List   Diagnosis Date Noted  . Pressure injury of skin 01/05/2017  . Altered mental status   . Palliative care by specialist   . Sepsis (HCC) 01/04/2017  . Atrial fibrillation and flutter (HCC) 01/04/2017  . AKI (acute kidney injury) (HCC) 01/04/2017  . HCAP (healthcare-associated pneumonia) 12/21/2016  . MYELOPATHY, CERVICAL SPINE 08/26/2010  . PRESSURE ULCER LOWER BACK 08/26/2010  . UTI 08/26/2010  . CVA 06/10/2010  . GAIT DISTURBANCE 06/10/2010  . HYPOALBUMINEMIA 04/20/2010  . ANEMIA-NOS 04/20/2010  . LOW BACK PAIN 04/20/2010  . EDEMA 04/20/2010  . AORTIC STENOSIS/ INSUFFICIENCY, NON-RHEUMATIC 06/30/2009  .  Diabetes mellitus type 2 in obese (HCC) 02/06/2007  . HYPERLIPIDEMIA 02/06/2007  . Essential hypertension 02/06/2007  . OSTEOARTHRITIS 02/06/2007    Palliative Care Assessment & Plan   Patient Profile: 83 y.o. female  with past medical history of CVA, diabetes, hypertension, dyslipidemia, debility, cervical spine myelopathy admitted on 01/20/2017 with hypoxia and hypothermia. Patient was found to have healthcare associated pneumonia and has been started on antibiotics. Her lactic acid on admission was 2.05. Patient  also exhibiting new features of atrial fib..  Pt became acutely [facility] on 01/05/17 , STAT CXR revealed almost total whiteout of left lung. Now this am as referenced above has declined further, on bipap minimally responsive   Assessment: Pt initally combative, agitated, confused. Now miinimaly repsonsive despite administration of romazicon after receiving ativan 0.5 mg .  Spoke with bedside RN as well as Dr. Jerral Ralph, and pt now DNR and full comfort care  Recommendations/Plan:  Pt now DNR/DNI  MS04 continuous infusion started for dyspnea and distress  Comfort care  Goals of Care and Additional Recommendations:  Limitations on Scope of Treatment: Full Comfort Care  Code Status:    Code Status Orders        Start     Ordered   2017/01/20 2334  Full code  Continuous     01/20/17 2333    Code Status History    Date Active Date Inactive Code Status Order ID Comments User Context   This patient has a current code status but no historical code status.       Prognosis:   Hours - Days  Discharge Planning:  Anticipated Hospital Death  Care plan was discussed with Dr. Jerral Ralph  Thank you for allowing the Palliative Medicine Team to assist in the care of this patient.   Time In: 0900 Time Out: 1030 Total Time 90 min Prolonged Time Billed  no       Greater than 50%  of this time was spent counseling and coordinating care related to the above assessment and plan.  Irean Hong, NP  Please contact Palliative Medicine Team phone at [phone] for questions and concerns.

## 2017-01-06 NOTE — Progress Notes (Signed)
0.5 mg ativan given per order of palliative care, pt started  desatting in the 70s and unresponsive, was placed on non rebreather with only moderate change in o2 sats to mid 80s, romazicon given - total dose of 0.5, pt placed on bipap, still only moderately responsive to stimuli at this time. 02 sats upper 90s. Will continue to monitor.

## 2017-01-06 NOTE — Progress Notes (Addendum)
Informed by RN earlier-that patient getting more hypoxic-requiring BiPAP. She was confused and agitated-required some Ativan, apparently her confusion and hypoxia worsened after Ativan. Rapid response was at bedside-and patient was placed on BiPAP.  Spoke with palliative care NP-who had a long telephonic discussion with the patient's niece in [place]-family currently in discussion and will get back to us regarding further goals of care and CODE STATUS.  On my arrival to the patient's room-shows an BiPAP, very lethargic. She was tachypneic, hardly moving any air-especially on the left lung field.   I subsequently spoke with the patient's niece in [place] over the phone, explained critical status-and recommended that we transition to full comfort measures. Patient's niece was not able to make a decision right away, she wanted to talk to her sister.   I attempted to call her back after 15-20 minutes-but unfortunately I got her voice mail. During this time, patient's other niece came to the unit, and were then we were able to get in touch with the patient's niece in [place] over the phone-we had a long discussion-we discussed overall poor prognoses-frail health and multiple medical problems, we discussed that she has a complete white out of the left lung due to mucous plugging/aspiration of secretions and that she is clearly deteriorating in spite of our best efforts. After much discussion-family was agreeable to transition to full comfort measures-DO NOT RESUSCITATE was entered into the chart.   I have discontinued antimicrobial therapy and other medications-goals of care are for full comfort measures-have instructed the nurse to begin a morphine infusion and take the patient off BiPAP. I anticipate she will likely pass while in the hospital-probably in the next few hours to a few days.  Total time spend 50 minutes  Time in 12:00 pm Time out 12:45 pm

## 2017-01-07 LAB — URINE CULTURE
Culture: >=100000 — AB
Special Requests: NORMAL

## 2017-01-08 LAB — CULTURE, BLOOD (ROUTINE X 2)
Culture: NO GROWTH
Culture: NO GROWTH
Special Requests: ADEQUATE
Special Requests: ADEQUATE

## 2017-01-08 LAB — GLUCOSE, CAPILLARY: Glucose-Capillary: 120 mg/dL — ABNORMAL HIGH (ref 65–99)

## 2017-02-04 NOTE — Death Summary Note (Signed)
DEATH SUMMARY   Patient Details  Name: Ashley Fritz MRN: [id number] DOB: 1926-12-16  Admission/Discharge Information   Admit Date:  2017/01/19  Date of Death: Date of Death: 01-23-2017  Time of Death: Time of Death: 1049  Length of Stay: 4  Referring Physician: Patient, No Pcp Per   Reason(s) for Hospitalization  Cough, hypoxia and shortness of breath  Diagnoses  Preliminary cause of death:  Secondary Diagnoses (including complications and co-morbidities):  Principal Problem:   HCAP (healthcare-associated pneumonia)  Active Problems:   Acute hypoxic respiratory failure   Acute metabolic encephalopathy   Diabetes mellitus type 2 in obese [facility])   Essential hypertension   UTI   Sepsis (HCC)   Atrial fibrillation and flutter (HCC)   AKI (acute kidney injury) (HCC)   Pressure injury of skin   Altered mental status   Palliative care by specialist   Pneumonia of left lung due to infectious organism   Brief Hospital Course (including significant findings, care, treatment, and services provided and events leading to death)  Brief Narrative: Patient is a 83 y.o. female with past medical history of CVA, diabetes, hypertension and dyslipidemia transferred from skilled nursing facility for evaluation of hypoxia, found to have hypothermia and clinical features consistent with healthcare associated pneumonia, she was subsequently admitted to the [facility] hospitalist service for further evaluation and treatment. Please see below for further details   Assessment/Plan: Acute hypoxic respiratory failure due to mucous plugging/aspiration pneumonia with left lung whiteout: Hospital course was marked by slow deterioration-patient had mucous plugging and subsequently developed hypoxic respiratory failure. She unfortunately did not respond to supportive care with bronchodilators, Mucomyst nebulizers, and chest physiotherapy. She was seen by critical care and deemed not to be a candidate for  aggressive measures including bronchoscopy. She briefly required BiPAP-after extensive discussion with family-she was transitioned to full comfort measures, all antimicrobial therapy was discontinued. Patient was subsequently started on IV morphine infusion, and subsequently expired on 6/20.  Acute metabolic encephalopathy: Suspect from hypoxia-managed with supportive measures  Aspiration pneumonia: Likely contributing to above-on broad-spectrum Zosyn-but once transitioned to comfort measures- all antibiotics were discontinued.  Acute kidney injury: Resolved, likely hemodynamically mediated in a setting of sepsis.   Atrial fibrillation: Seen on EKG on admission-has spontaneously converted back to sinus rhythm. Was not on any rate controlling agents, she was a very poor long-term candidate for anticoagulation.Chadsvasc score of at least 5.   Chronic systolic heart failure: This was stable throughout the hospital course.  Echo on 6/1 showed EF around 40%.  Insulin-dependent type 2 diabetes with hypoglycemia:  Hospital course was marked by intermittent hypoglycemia-insulin regimen was adjusted. Once transitioned to comfort measures-all insulin was discontinued.  Hypertension:  was controlled throughout this hospital stay.   Pertinent Labs and Studies  Significant Diagnostic Studies Dg Chest 2 View  Result Date: 2017-01-19 CLINICAL DATA:  Altered mental status. EXAM: CHEST  2 VIEW COMPARISON:  02/03/2015 FINDINGS: There is marked cardiac enlargement. Aortic atherosclerosis noted. Diminished lung volumes. This is particularly notable involving the lateral chest radiograph. There is no pleural effusion or edema. IMPRESSION: 1. Cardiac enlargement and low lung volumes. 2.  Aortic Atherosclerosis (ICD10-I70.0). Electronically Signed   By: Signa Kell M.D.   On: 2017-01-19 18:41   Ct Head Wo Contrast  Result Date: 2017/01/19 CLINICAL DATA:  Altered mentation EXAM: CT HEAD WITHOUT CONTRAST  TECHNIQUE: Contiguous axial images were obtained from the base of the skull through the vertex without intravenous contrast. COMPARISON:  04/17/2010 head  CT, 08/16/2010 MRI FINDINGS: BRAIN: There is sulcal and ventricular prominence consistent with superficial and central atrophy. No intraparenchymal hemorrhage, mass effect nor midline shift. Periventricular and subcortical white matter hypodensities consistent with chronic small vessel ischemic disease are identified. No acute large vascular territory infarcts. No abnormal extra-axial fluid collections. Basal cisterns are not effaced and midline. VASCULAR: Moderate calcific atherosclerosis of the carotid siphons. SKULL: No skull fracture. No significant scalp soft tissue swelling. SINUSES/ORBITS: The mastoid air-cells are clear. The included paranasal sinuses are well-aerated.The included ocular globes and orbital contents are non-suspicious. Prior right lens surgical change. OTHER: Partial opacification of the mastoid air cells bilaterally appear IMPRESSION: 1. Atrophy with chronic small vessel ischemia. 2. No acute intracranial abnormality. 3. Small bilateral chronic mastoid effusions. Electronically Signed   By: Tollie Ethavid  Kwon M.D.   On: Jul 06, 2017 19:03   Ct Chest W Contrast  Result Date: 2017-03-18 CLINICAL DATA:  83 y/o  F; altered mental status, low oxygen level. EXAM: CT CHEST WITH CONTRAST TECHNIQUE: Multidetector CT imaging of the chest was performed during intravenous contrast administration. CONTRAST:  75mL ISOVUE-300 IOPAMIDOL (ISOVUE-300) INJECTION 61% COMPARISON:  02/03/2015 CT chest.  Jul 06, 2017 chest radiograph. FINDINGS: Cardiovascular: Moderate to severe cardiomegaly. Severe coronary artery calcification. Mitral annular and aortic valvular calcification. No pericardial effusion. Normal caliber thoracic aorta with mild calcific atherosclerosis. Mediastinum/Nodes: Normal thyroid gland. No mediastinal adenopathy. There is apparent debris in the  central airways, fluid levels in lobar and segmental airways, and mucous plugging in the lung bases. Lungs/Pleura: Consolidation within the left upper lobe along the major fissure extending into the lingula and patchy opacification in dependent lower lobes bilaterally. Upper Abdomen: Subcentimeter cyst within the left kidney interpolar region. Musculoskeletal: Extensive degenerative changes of the thoracic spine. No acute osseous abnormality is identified. IMPRESSION: 1. Debris within the central airways extending into the lung bases, distribution suggest aspiration. 2. Dependent consolidation within the left upper lobe along the major fissure and in dependent lower lobes may represent atelectasis or pneumonia. 3. Moderate to severe cardiomegaly. Coronary and aortic calcific atherosclerosis. 4. Aortic valvular and mitral annular calcification, correlate for valvular dysfunction. Electronically Signed   By: Mitzi HansenLance  Furusawa-Stratton M.D.   On: Jul 06, 2017 20:47   Dg Chest Port 1 View  Result Date: 01/06/2017 CLINICAL DATA:  Followup shortness of breath. EXAM: PORTABLE CHEST 1 VIEW COMPARISON:  01/05/2017 and previous. FINDINGS: Continued complete opacification of the left hemithorax consistent with left lung atelectasis, possibly with some pleural fluid associated. Some worsening of patchy infiltrate in the right lower lobe. IMPRESSION: Continued collapse/ consolidation of the left lung. Developing patchy infiltrate in the right lower lobe. Electronically Signed   By: Paulina FusiMark  Shogry M.D.   On: 01/06/2017 07:55   Dg Chest Port 1 View  Result Date: 01/05/2017 CLINICAL DATA:  Decreased oxygen saturation today. Shortness of breath. EXAM: PORTABLE CHEST 1 VIEW COMPARISON:  CT chest and PA and lateral chest Jul 06, 2017. FINDINGS: There is new complete whiteout of the left chest. Right lung is expanded and clear. Cardiomegaly is identified. No pneumothorax. Aortic atherosclerosis is noted. IMPRESSION: New complete whiteout  of the left chest consistent with diffuse airspace disease and possibly a pleural effusion. Cardiomegaly without edema. Atherosclerosis. Electronically Signed   By: Drusilla Kannerhomas  Dalessio M.D.   On: 01/05/2017 13:54    Microbiology Recent Results (from the past 240 hour(s))  Urine culture     Status: Abnormal   Collection Time: Mar 13, 2017  6:46 PM  Result Value Ref Range Status   Specimen  Description URINE, CATHETERIZED  Final   Special Requests Normal  Final   Culture (A)  Final    >=100,000 COLONIES/mL ENTEROCOCCUS FAECALIS >=100,000 COLONIES/mL VIRIDANS STREPTOCOCCUS    Report Status 01/10/2017 FINAL  Final   Organism ID, Bacteria ENTEROCOCCUS FAECALIS (A)  Final      Susceptibility   Enterococcus faecalis - MIC*    AMPICILLIN <=2 SENSITIVE Sensitive     LEVOFLOXACIN 1 SENSITIVE Sensitive     NITROFURANTOIN <=16 SENSITIVE Sensitive     VANCOMYCIN 1 SENSITIVE Sensitive     * >=100,000 COLONIES/mL ENTEROCOCCUS FAECALIS  Culture, blood (Routine X 2) w Reflex to ID Panel     Status: None (Preliminary result)   Collection Time: 01-23-17  9:17 PM  Result Value Ref Range Status   Specimen Description BLOOD LEFT HAND  Final   Special Requests   Final    BOTTLES DRAWN AEROBIC ONLY Blood Culture adequate volume   Culture NO GROWTH 3 DAYS  Final   Report Status PENDING  Incomplete  Culture, blood (Routine X 2) w Reflex to ID Panel     Status: None (Preliminary result)   Collection Time: 2017-01-23  9:23 PM  Result Value Ref Range Status   Specimen Description BLOOD LEFT HAND  Final   Special Requests IN PEDIATRIC BOTTLE Blood Culture adequate volume  Final   Culture NO GROWTH 3 DAYS  Final   Report Status PENDING  Incomplete  MRSA PCR Screening     Status: None   Collection Time: 01/04/17 12:47 AM  Result Value Ref Range Status   MRSA by PCR NEGATIVE NEGATIVE Final    Comment:        The GeneXpert MRSA Assay (FDA approved for NASAL specimens only), is one component of a comprehensive MRSA  colonization surveillance program. It is not intended to diagnose MRSA infection nor to guide or monitor treatment for MRSA infections.     Lab Basic Metabolic Panel:  Recent Labs Lab Jan 23, 2017 1905 01/23/17 1928 01/04/17 0159 01/05/17 0600 01/06/17 0659  NA 135 135 137 142 141  K 4.3 4.3 4.1 4.1 3.8  CL 93* 91* 97* 104 101  CO2 34*  --  30 31 32  GLUCOSE 165* 157* 162* 46* 114*  BUN 19 23* 15 8 5*  CREATININE 1.09* 1.10* 0.80 0.48 0.52  CALCIUM 9.2  --  9.0 8.5* 8.7*  MG  --   --   --  1.8  --    Liver Function Tests:  Recent Labs Lab 01-23-17 1905 01/06/17 0659  AST 20 21  ALT 13* 16  ALKPHOS 51 57  BILITOT 0.7 0.7  PROT 5.8* 6.4*  ALBUMIN 3.3* 3.0*   No results for input(s): LIPASE, AMYLASE in the last 168 hours.  Recent Labs Lab 01-23-17 1905  AMMONIA 17   CBC:  Recent Labs Lab 23-Jan-2017 1905 01-23-2017 1928 01/04/17 0159 01/05/17 0600 01/06/17 0659  WBC 6.1  --  5.1 5.0 4.8  NEUTROABS 3.3  --  2.6  --   --   HGB 12.4 13.9 13.1 12.7 13.0  HCT 40.0 41.0 42.6 42.6 44.2  MCV 92.0  --  92.2 93.6 95.5  PLT 172  --  178 184 188   Cardiac Enzymes: No results for input(s): CKTOTAL, CKMB, CKMBINDEX, TROPONINI in the last 168 hours. Sepsis Labs:  Recent Labs Lab 01/23/2017 1905 2017/01/23 1928 01/23/17 2327 2017/01/23 2343 01/04/17 0159 01/05/17 0600 01/06/17 0659  PROCALCITON  --   --   --  <  0.10  --   --  <0.10  WBC 6.1  --   --   --  5.1 5.0 4.8  LATICACIDVEN  --  2.05* 1.8  --  1.8  --   --     Procedures/Operations   [employer] 2017-01-17, 12:37 PM

## 2017-02-04 NOTE — Progress Notes (Addendum)
Seen and examined  Pulse 63, last respiratory rate 5, 81% O2 saturation on room air  Impression: Acute hypoxic respiratory failure and left lung whiteout due to aspiration pneumonia/mucus plugging  Plan:On a morphine drip-breathing around 6-7 breaths a minute-with significant apneic spells for about 10 seconds. She appears comfortable, I spoke with RN -symptoms are stable on current medications. I anticipate inpatient death. Plans are to continue with current care-palliative care will speak with family later.

## 2017-02-04 NOTE — Progress Notes (Signed)
Daily Progress Note   Patient Name: Ashley Fritz       Date: 02/01/2017 DOB: 03-24-1927  Age: 83 y.o. MRN#: [id number] Attending Physician: Maretta Bees, MD Primary Care Physician: Patient, No Pcp Per Admit Date: 01/23/2017  Reason for Consultation/Follow-up: Establishing goals of care, Non pain symptom management, Pain control, Psychosocial/spiritual support and Terminal Care  Subjective: Patient appears very peaceful. Respirations are irregular but unlabored. She is not agitated. I did call her niece and updated her this morning. She is on her way from [place] today to see her aunt before she dies  Length of Stay: 4  Current Medications: Scheduled Meds:  . flumazenil  0.5 mg Intravenous Once  . sodium chloride flush  3 mL Intravenous Q12H    Continuous Infusions: . sodium chloride    . chlorproMAZINE (THORAZINE) IV    . morphine 2 mg/hr (01/20/2017 0000)    PRN Meds: sodium chloride, acetaminophen **OR** acetaminophen, albuterol, antiseptic oral rinse, chlorproMAZINE (THORAZINE) IV, glycopyrrolate **OR** glycopyrrolate **OR** glycopyrrolate, haloperidol **OR** haloperidol **OR** haloperidol lactate, LORazepam **OR** LORazepam **OR** LORazepam, morphine, ondansetron **OR** ondansetron (ZOFRAN) IV, polyvinyl alcohol, sodium chloride flush  Physical Exam  Constitutional: She appears well-developed and well-nourished.  Elderly female who appears acutely ill; transitioning towards end-of-life  HENT:  Head: Normocephalic and atraumatic.  Cardiovascular:  Irregular There is faint radial pulses  Pulmonary/Chest:  respirations unlabored, irregular No upper airway secretions Brief 5-10 seconds intermittent apnea  Genitourinary:  Genitourinary Comments: Foley  Skin: Skin is  warm and dry.  Psychiatric:  No agitation Appears very peaceful  Nursing note and vitals reviewed.           Vital Signs: BP (!) 150/125   Pulse 85   Temp 98 F (36.7 C) (Axillary)   Resp (!) 7   Ht 5\' 7"  (1.702 m)   Wt 94.2 kg (207 lb 11.2 oz)   SpO2 100%   BMI 32.53 kg/m  SpO2: SpO2: 100 % O2 Device: O2 Device: Nasal Cannula O2 Flow Rate: O2 Flow Rate (L/min): 3 L/min  Intake/output summary:  Intake/Output Summary (Last 24 hours) at 01/25/2017 1030 Last data filed at 01/28/2017 0000  Gross per 24 hour  Intake            16.95 ml  Output  0 ml  Net            16.95 ml   LBM: Last BM Date: 01/04/17 Baseline Weight: Weight: 96.2 kg (212 lb) Most recent weight: Weight: 94.2 kg (207 lb 11.2 oz)       Palliative Assessment/Data:    Flowsheet Rows     Most Recent Value  Intake Tab  Referral Department  Hospitalist  Unit at Time of Referral  Intermediate Care Unit  Palliative Care Primary Diagnosis  Sepsis/Infectious Disease  Date Notified  01/05/17  Palliative Care Type  New Palliative care  Reason for referral  Clarify Goals of Care  Date of Admission  01-19-17  Date first seen by Palliative Care  01/05/17  # of days Palliative referral response time  0 Day(s)  # of days IP prior to Palliative referral  2  Clinical Assessment  Palliative Performance Scale Score  30%  Pain Max last 24 hours  Not able to report  Pain Min Last 24 hours  Not able to report  Dyspnea Max Last 24 Hours  Not able to report  Dyspnea Min Last 24 hours  Not able to report  Nausea Max Last 24 Hours  Not able to report  Nausea Min Last 24 Hours  Not able to report  Anxiety Max Last 24 Hours  Not able to report  Anxiety Min Last 24 Hours  Not able to report  Other Max Last 24 Hours  Not able to report  Psychosocial & Spiritual Assessment  Palliative Care Outcomes  Patient/Family meeting held?  Yes  Who was at the meeting?  pt and pt's niece via phone  Palliative Care  follow-up planned  Yes, Facility      Patient Active Problem List   Diagnosis Date Noted  . Pneumonia of left lung due to infectious organism   . Pressure injury of skin 01/05/2017  . Altered mental status   . Palliative care by specialist   . Sepsis (HCC) 01/04/2017  . Atrial fibrillation and flutter (HCC) 01/04/2017  . AKI (acute kidney injury) (HCC) 01/04/2017  . HCAP (healthcare-associated pneumonia) 2017-01-19  . MYELOPATHY, CERVICAL SPINE 08/26/2010  . PRESSURE ULCER LOWER BACK 08/26/2010  . UTI 08/26/2010  . CVA 06/10/2010  . GAIT DISTURBANCE 06/10/2010  . HYPOALBUMINEMIA 04/20/2010  . ANEMIA-NOS 04/20/2010  . LOW BACK PAIN 04/20/2010  . EDEMA 04/20/2010  . AORTIC STENOSIS/ INSUFFICIENCY, NON-RHEUMATIC 06/30/2009  . Diabetes mellitus type 2 in obese (HCC) 02/06/2007  . HYPERLIPIDEMIA 02/06/2007  . Essential hypertension 02/06/2007  . OSTEOARTHRITIS 02/06/2007    Palliative Care Assessment & Plan   Patient Profile: 83 y.o.femalewith past medical history of CVA, diabetes, hypertension, dyslipidemia, debility, cervical spine myelopathyadmitted on 2018-06-15with hypoxia and hypothermia. Patient was found to have healthcare associated pneumonia and has been started on antibiotics. Her lactic acid on admission was 2.05. Patient also exhibiting new features of atrial fib..  Pt became acutely [facility] on 01/05/17 , STAT CXR revealed almost total whiteout of left lung. Now this am as referenced above has declined further, on bipap minimally responsive  Patient is now off BiPAP and full comfort care. She appears  more comfortable on morphine infusion. She is full comfort care now  Assessment: Updated patient's niece as to current clinical condition specifically how comfortable her aunt appears. She is on her way from [place] to see her aunt before she dies. Chart reviewed. Minimal PRN's noted to morphine infusion  Recommendations/Plan:  Dyspnea/pain: Continue morphine infusion  at 2  mg an hour. Monitor breakthrough doses and titrate for effect  Goals of Care and Additional Recommendations:  Limitations on Scope of Treatment: Full Comfort Care  Code Status:    Code Status Orders        Start     Ordered   01/06/17 1212  Do not attempt resuscitation (DNR)  Continuous    Question Answer Comment  In the event of cardiac or respiratory ARREST Do not call a "code blue"   In the event of cardiac or respiratory ARREST Do not perform Intubation, CPR, defibrillation or ACLS   In the event of cardiac or respiratory ARREST Use medication by any route, position, wound care, and other measures to relive pain and suffering. May use oxygen, suction and manual treatment of airway obstruction as needed for comfort.      01/06/17 1215    Code Status History    Date Active Date Inactive Code Status Order ID Comments User Context   Jul 14, 2017 11:33 PM 01/06/2017 12:10 PM Full Code [id number]  Jonah BlueYates, Jennifer, MD Inpatient       Prognosis:   Hours - Days  Discharge Planning:  Anticipated Hospital Death  Care plan was discussed with Dr. Jerral RalphGhimire  Thank you for allowing the Palliative Medicine Team to assist in the care of this patient.   Time In: 0830 Time Out: 0900 Total Time 30 min Prolonged Time Billed  no       Greater than 50%  of this time was spent counseling and coordinating care related to the above assessment and plan.  Irean HongSarah Grace Avital Dancy, NP  Please contact Palliative Medicine Team phone at [phone] for questions and concerns.

## 2017-02-04 NOTE — Progress Notes (Signed)
Patient expired at 10:47am; confirmed death per two nurses - Lelon Frohlichammy Frutoso Dimare, RN and Orson EvaKristy Johnson, RN.  Dr. Jerral RalphGhimire, attending, notified and HCPOA, Nance PearCarol Anderson, also called.  Okey RegalCarol states there are several family members on their way to the hospital and request that patient remain on the unit until their arrival.

## 2017-02-04 DEATH — deceased

## 2018-07-05 IMAGING — CT CT CHEST W/ CM
2 of 3 series · 15 of 36 positions shown, 18 images · IV contrast (APPLIED)
Comparison: 02/03/2015 CT chest.  01/03/2017 chest radiograph.

CLINICAL DATA: 89 y/o  F; altered mental status, low oxygen level.

EXAM:
CT CHEST WITH CONTRAST
TECHNIQUE: Multidetector CT imaging of the chest was performed during
intravenous contrast administration.
CONTRAST:  75mL [id number]-UII IOPAMIDOL ([id number]) INJECTION 61%

[Series 3: thorax 2.0 i31f 2 · axial · 0.60mm/px · z∈[+1195,+1407]mm · 12 of 126 slices shown, 15 images]
[im 10/126  mediastinal]
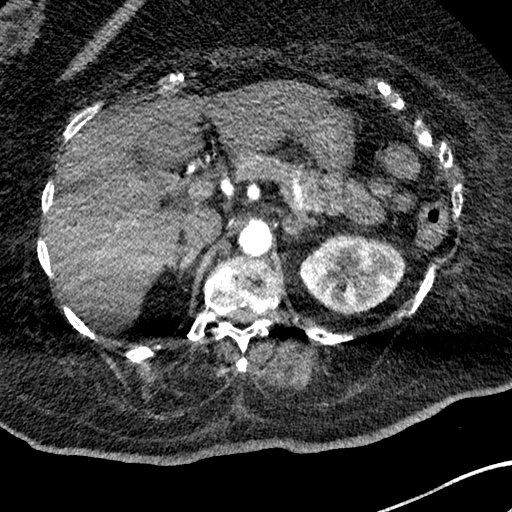
[im 10/126  lung]
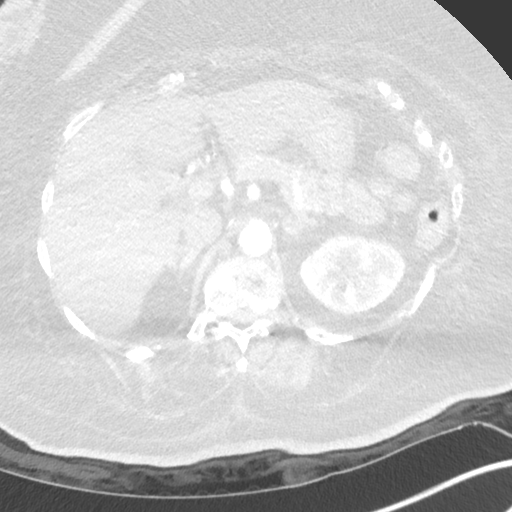
[im 19/126  lung]
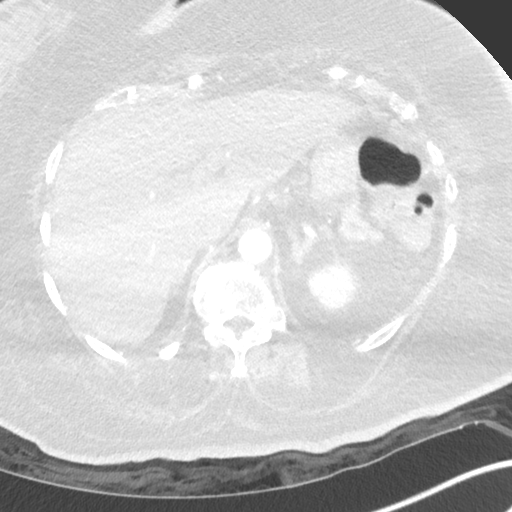
[im 28/126  lung]
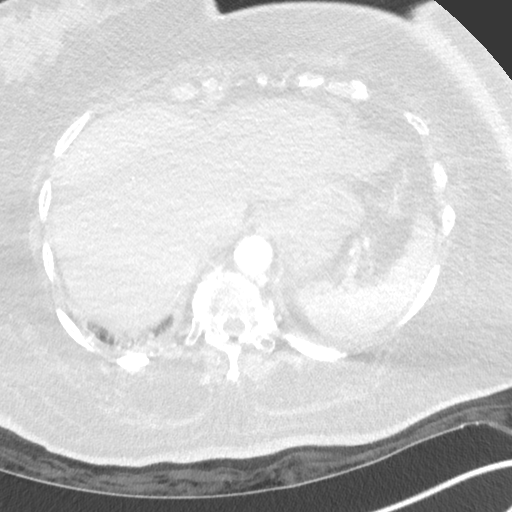
[im 38/126  lung]
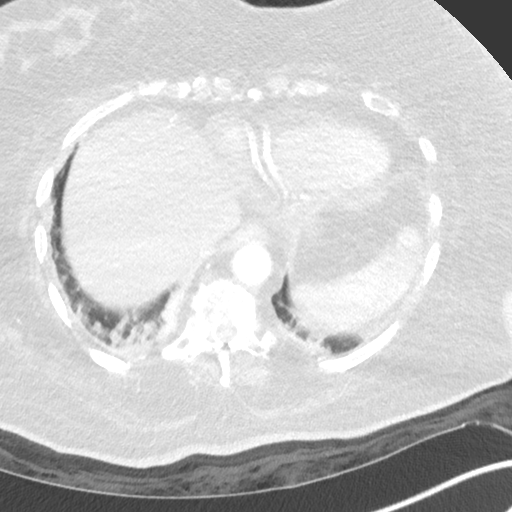
[im 47/126  mediastinal]
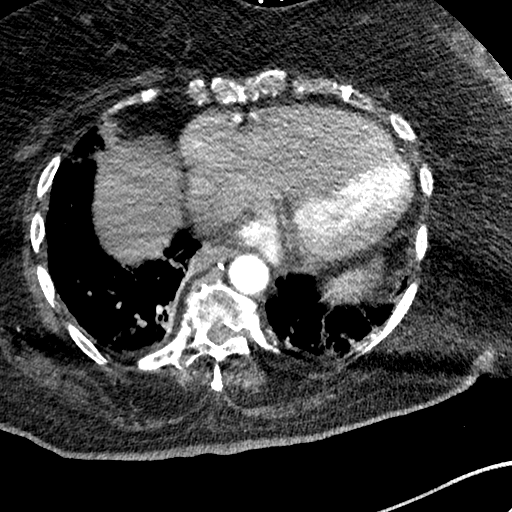
[im 47/126  lung]
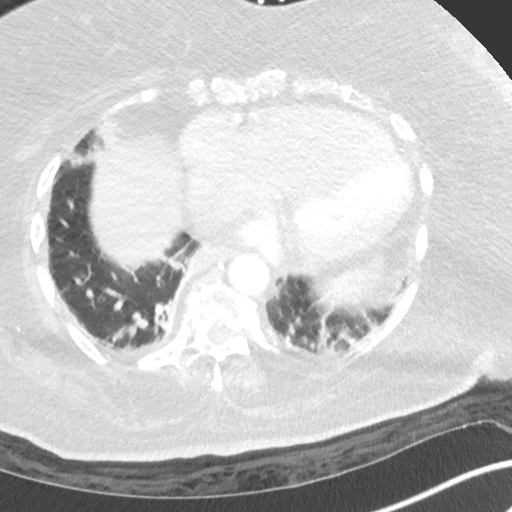
[im 56/126  lung]
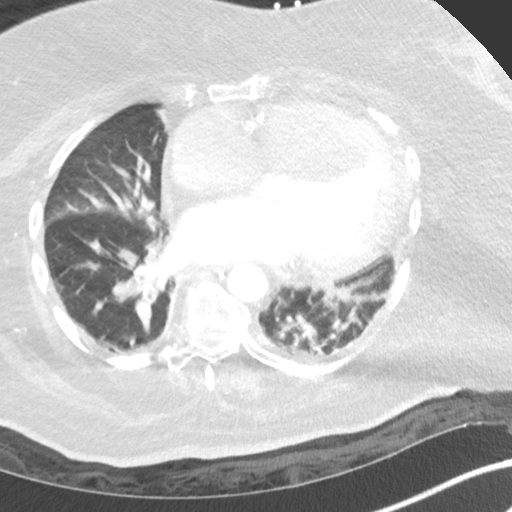
[im 70/126  lung]
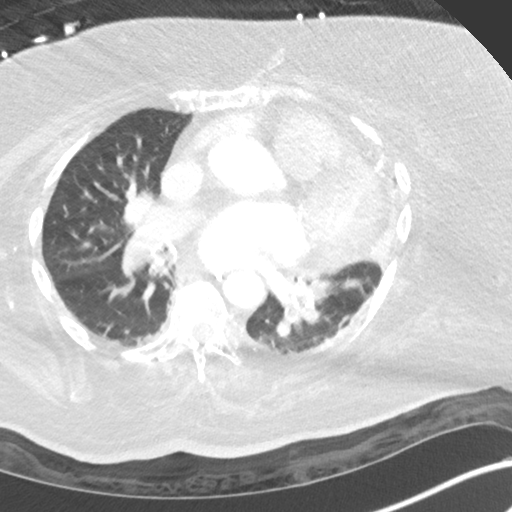
[im 79/126  lung]
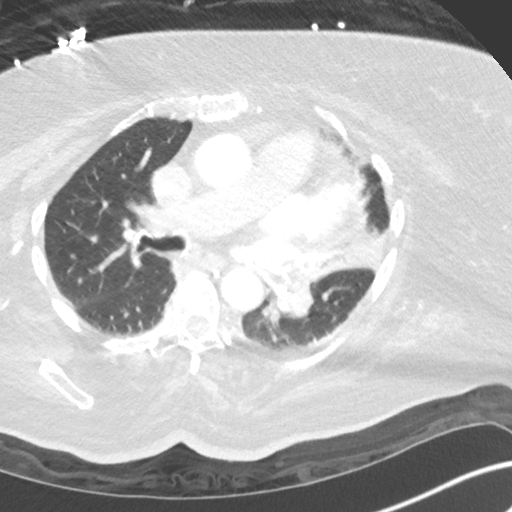
[im 88/126  mediastinal]
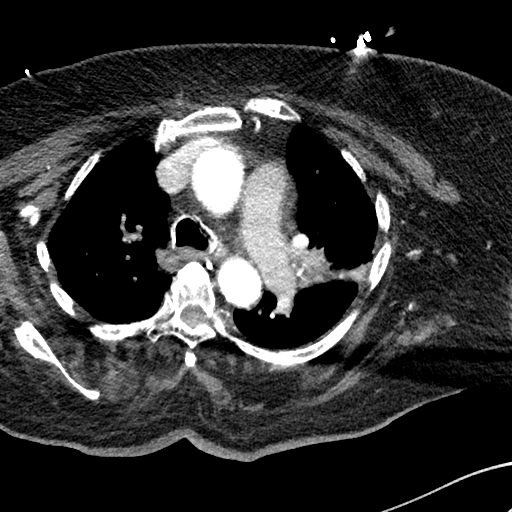
[im 88/126  lung]
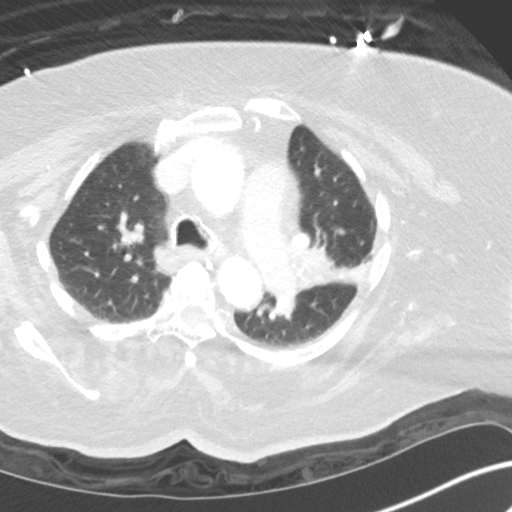
[im 98/126  lung]
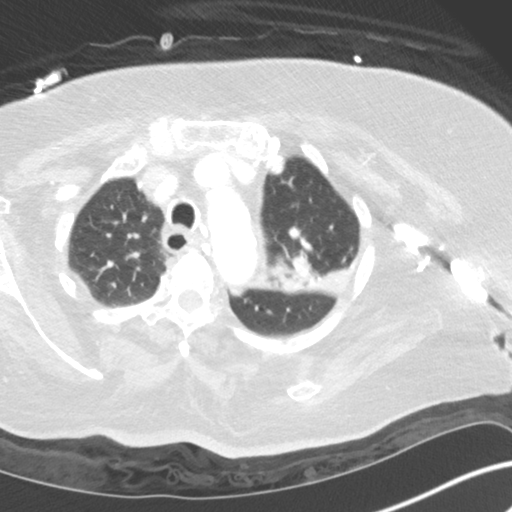
[im 107/126  lung]
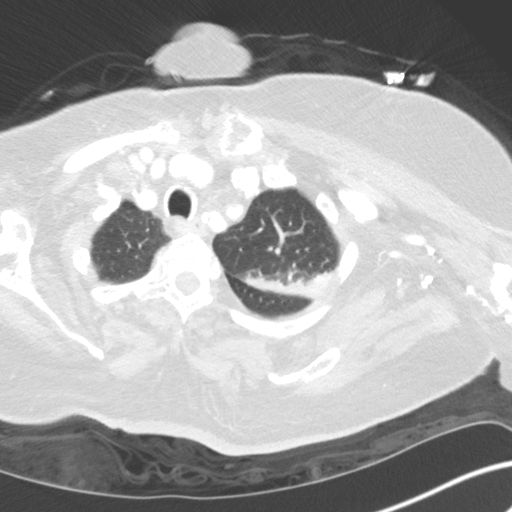
[im 116/126  lung]
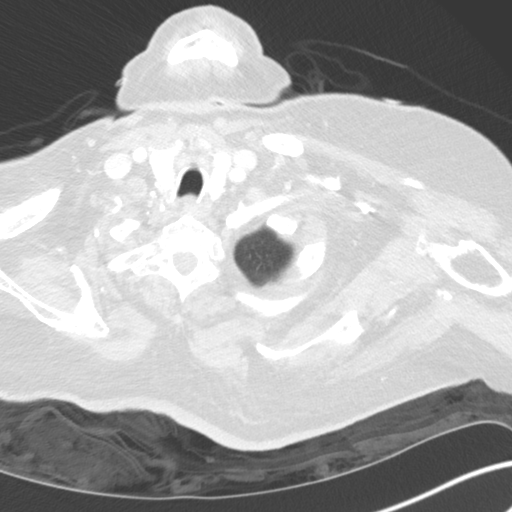

[Series 5: coronal · coronal · 0.51mm/px · 3 of 115 slices shown]
[im 23/115  lung]
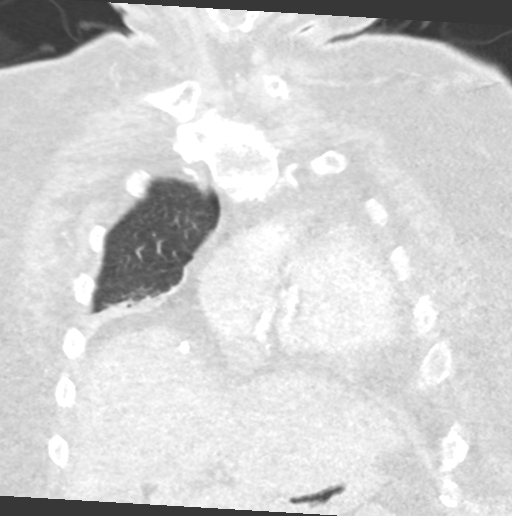
[im 46/115  lung]
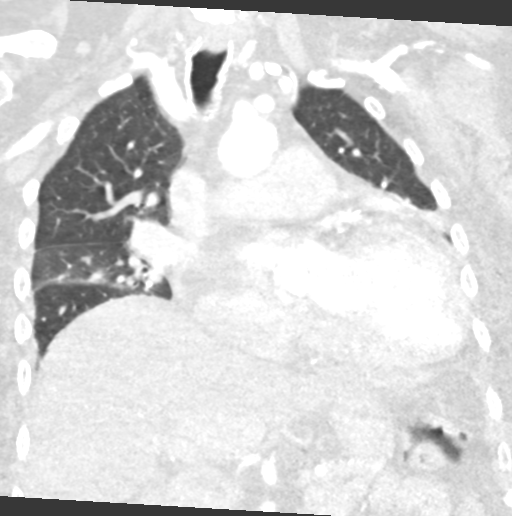
[im 69/115  lung]
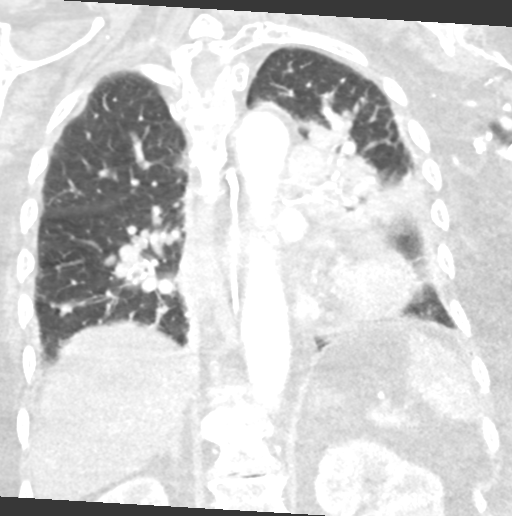

[15 of 36 positions shown; findings below may reference images not displayed]

FINDINGS: Cardiovascular: Moderate to severe cardiomegaly. Severe coronary
artery calcification. Mitral annular and aortic valvular
calcification. No pericardial effusion. Normal caliber thoracic
aorta with mild calcific atherosclerosis.

Mediastinum/Nodes: Normal thyroid gland. No mediastinal adenopathy.
There is apparent debris in the central airways, fluid levels in
lobar and segmental airways, and mucous plugging in the lung bases.

Lungs/Pleura: Consolidation within the left upper lobe along the
major fissure extending into the lingula and patchy opacification in
dependent lower lobes bilaterally.

Upper Abdomen: Subcentimeter cyst within the left kidney interpolar
region.

Musculoskeletal: Extensive degenerative changes of the thoracic
spine. No acute osseous abnormality is identified.
IMPRESSION: 1. Debris within the central airways extending into the lung bases,
distribution suggest aspiration.
2. Dependent consolidation within the left upper lobe along the
major fissure and in dependent lower lobes may represent atelectasis
or pneumonia.
3. Moderate to severe cardiomegaly. Coronary and aortic calcific
atherosclerosis.
4. Aortic valvular and mitral annular calcification, correlate for
valvular dysfunction.

By: Zeezo Kadam M.D.
